# Patient Record
Sex: Female | Born: 1935 | Race: White | Hispanic: No | Smoking: Never smoker
Health system: Southern US, Community
[De-identification: ages and names within clinical notes are randomized; demographics above are authoritative.]

## PROBLEM LIST (undated history)

## (undated) DIAGNOSIS — E785 Hyperlipidemia, unspecified: Secondary | ICD-10-CM

## (undated) DIAGNOSIS — I10 Essential (primary) hypertension: Secondary | ICD-10-CM

## (undated) HISTORY — DX: Essential (primary) hypertension: I10

## (undated) HISTORY — DX: Hyperlipidemia, unspecified: E78.5

## (undated) HISTORY — PX: ABDOMINAL HYSTERECTOMY: SHX81

## (undated) HISTORY — PX: PARTIAL KNEE ARTHROPLASTY: SHX2174

---

## 2015-05-10 DIAGNOSIS — Z23 Encounter for immunization: Secondary | ICD-10-CM | POA: Diagnosis not present

## 2015-10-08 DIAGNOSIS — E669 Obesity, unspecified: Secondary | ICD-10-CM | POA: Diagnosis not present

## 2015-10-08 DIAGNOSIS — Z0000 Encounter for general adult medical examination without abnormal findings: Secondary | ICD-10-CM | POA: Diagnosis not present

## 2015-10-08 DIAGNOSIS — I10 Essential (primary) hypertension: Secondary | ICD-10-CM | POA: Diagnosis not present

## 2015-10-08 DIAGNOSIS — E785 Hyperlipidemia, unspecified: Secondary | ICD-10-CM | POA: Diagnosis not present

## 2015-10-08 DIAGNOSIS — K219 Gastro-esophageal reflux disease without esophagitis: Secondary | ICD-10-CM | POA: Diagnosis not present

## 2015-10-10 DIAGNOSIS — J069 Acute upper respiratory infection, unspecified: Secondary | ICD-10-CM | POA: Diagnosis not present

## 2015-10-10 DIAGNOSIS — M1712 Unilateral primary osteoarthritis, left knee: Secondary | ICD-10-CM | POA: Diagnosis not present

## 2015-11-12 DIAGNOSIS — M2550 Pain in unspecified joint: Secondary | ICD-10-CM | POA: Diagnosis not present

## 2015-12-12 DIAGNOSIS — M549 Dorsalgia, unspecified: Secondary | ICD-10-CM | POA: Diagnosis not present

## 2015-12-19 DIAGNOSIS — R300 Dysuria: Secondary | ICD-10-CM | POA: Diagnosis not present

## 2016-01-28 DIAGNOSIS — M549 Dorsalgia, unspecified: Secondary | ICD-10-CM | POA: Diagnosis not present

## 2016-02-28 DIAGNOSIS — M549 Dorsalgia, unspecified: Secondary | ICD-10-CM | POA: Diagnosis not present

## 2016-03-15 DIAGNOSIS — R350 Frequency of micturition: Secondary | ICD-10-CM | POA: Diagnosis not present

## 2016-03-15 DIAGNOSIS — N3001 Acute cystitis with hematuria: Secondary | ICD-10-CM | POA: Diagnosis not present

## 2016-05-04 DIAGNOSIS — Z23 Encounter for immunization: Secondary | ICD-10-CM | POA: Diagnosis not present

## 2016-06-16 DIAGNOSIS — H6120 Impacted cerumen, unspecified ear: Secondary | ICD-10-CM | POA: Diagnosis not present

## 2016-08-05 ENCOUNTER — Encounter: Payer: Self-pay | Admitting: Pediatrics

## 2016-08-05 ENCOUNTER — Encounter (INDEPENDENT_AMBULATORY_CARE_PROVIDER_SITE_OTHER): Payer: Self-pay

## 2016-08-05 ENCOUNTER — Ambulatory Visit (INDEPENDENT_AMBULATORY_CARE_PROVIDER_SITE_OTHER): Payer: Medicare HMO | Admitting: Pediatrics

## 2016-08-05 VITALS — BP 125/82 | HR 74 | Temp 96.9°F | Ht 71.0 in | Wt 278.0 lb

## 2016-08-05 DIAGNOSIS — I10 Essential (primary) hypertension: Secondary | ICD-10-CM

## 2016-08-05 DIAGNOSIS — Z23 Encounter for immunization: Secondary | ICD-10-CM | POA: Diagnosis not present

## 2016-08-05 DIAGNOSIS — E785 Hyperlipidemia, unspecified: Secondary | ICD-10-CM | POA: Diagnosis not present

## 2016-08-05 DIAGNOSIS — K219 Gastro-esophageal reflux disease without esophagitis: Secondary | ICD-10-CM | POA: Diagnosis not present

## 2016-08-05 DIAGNOSIS — D649 Anemia, unspecified: Secondary | ICD-10-CM

## 2016-08-05 DIAGNOSIS — Z780 Asymptomatic menopausal state: Secondary | ICD-10-CM | POA: Diagnosis not present

## 2016-08-05 DIAGNOSIS — R5383 Other fatigue: Secondary | ICD-10-CM

## 2016-08-05 MED ORDER — PANTOPRAZOLE SODIUM 40 MG PO TBEC: 40.0000 mg | DELAYED_RELEASE_TABLET | Freq: Every day | ORAL | 3 refills | Status: DC

## 2016-08-05 MED ORDER — ATORVASTATIN CALCIUM 20 MG PO TABS: 20.0000 mg | ORAL_TABLET | Freq: Every day | ORAL | 3 refills | Status: DC

## 2016-08-05 MED ORDER — LISINOPRIL 20 MG PO TABS: 20.0000 mg | ORAL_TABLET | Freq: Every day | ORAL | 3 refills | Status: DC

## 2016-08-05 NOTE — Progress Notes (Signed)
Subjective:   Patient ID: Anita Daniels, female    DOB: 1936/04/19, 81 y.o.   MRN: [id number] CC: New Patient (Initial Visit) follow up multiple med problems  HPI: Anita Daniels is a 81 y.o. female presenting for New Patient (Initial Visit)  Lives in [place] out with 2yo grandson, feels tired by the end of the day Other days has fairly good energy levels  Elevated BMI: Drinks [place] and Coke daily in the morning  HTN: takes medicines regularly No CP, no SOB, no HA  Pt has had u/s to look for abd aneurysm given mother's hx, normal per pt  GER: fried foods irritate stomach  Colon ca screen: never had colonoscopy, had stool occult blood test several years ago was normal Normal stooling now, no blood Bone density scan: last over 5 yrs ago Flu shot in Oct 2017 Pneumonia vaccine: says has never received No h/o abnormal mammogram   Past Medical History:  Diagnosis Date  . Hyperlipidemia   . Hypertension    Fam hx: father died long time ago, pt was 6yo Mom died of abd aneurysm  Social History   Social History  . Marital status: Widowed    Spouse name: N/A  . Number of children: N/A  . Years of education: N/A   Social History Main Topics  . Smoking status: Never Smoker  . Smokeless tobacco: Never Used  . Alcohol use No  . Drug use: No  . Sexual activity: No   Other Topics Concern  . None   Social History Narrative  . None   ROS: All systems negative other than what is in HPI  Objective:    BP 125/82   Pulse 74   Temp (!) 96.9 F (36.1 C) (Oral)   Ht 5' 11"  (1.803 m)   Wt 278 lb (126.1 kg)   BMI 38.77 kg/m   Wt Readings from Last 3 Encounters:  08/05/16 278 lb (126.1 kg)    Gen: NAD, alert, cooperative with exam, NCAT EYES: EOMI, no conjunctival injection, or no icterus ENT: OP without erythema LYMPH: no cervical LAD CV: NRRR, normal S1/S2, no murmur, distal pulses 2+ b/l Resp: CTABL, no wheezes, normal WOB Abd: +BS, soft, NTND.  no guarding or organomegaly Ext: No edema, warm Neuro: Alert and oriented, strength equal b/l UE and LE, valgus gait MSK: normal muscle bulk  Assessment & Plan:  Anita Daniels was seen today for new patient (initial visit), follow up med problems.  Diagnoses and all orders for this visit:  Hyperlipidemia, unspecified hyperlipidemia type Stable Cont atorvastatinv -     Lipid panel -     atorvastatin (LIPITOR) 20 MG tablet; Take 1 tablet (20 mg total) by mouth daily.  Essential hypertension Well controlled today, cont lisinopril -     CMP14+EGFR -     lisinopril (PRINIVIL,ZESTRIL) 20 MG tablet; Take 1 tablet (20 mg total) by mouth daily.  Anemia, unspecified type -     CBC with Differential/Platelet  Other fatigue -     TSH  Post-menopausal -     DG [facility] DEXA  Gastroesophageal reflux disease, esophagitis presence not specified Well controlled with below Takes daily Not sure if symptoms worse without it, try stopping PPI -     pantoprazole (PROTONIX) 40 MG tablet; Take 1 tablet (40 mg total) by mouth daily.  Need for vaccination against Streptococcus pneumoniae using pneumococcal conjugate vaccine 7 -     Pneumococcal conjugate vaccine 13-valent   Follow up  plan: Return in about 6 months (around 02/02/2017). Assunta Found, MD [place]

## 2016-08-06 LAB — CMP14+EGFR
ALBUMIN: 4 g/dL (ref 3.5–4.8)
ALK PHOS: 96 IU/L (ref 39–117)
ALT: 9 IU/L (ref 0–32)
AST: 15 IU/L (ref 0–40)
Albumin/Globulin Ratio: 1.4 (ref 1.2–2.2)
BUN / CREAT RATIO: 21 (ref 12–28)
BUN: 16 mg/dL (ref 8–27)
Bilirubin Total: 0.8 mg/dL (ref 0.0–1.2)
CO2: 22 mmol/L (ref 18–29)
CREATININE: 0.77 mg/dL (ref 0.57–1.00)
Calcium: 8.7 mg/dL (ref 8.7–10.3)
Chloride: 103 mmol/L (ref 96–106)
GFR, EST AFRICAN AMERICAN: 85 mL/min/{1.73_m2} (ref >59)
GFR, EST NON AFRICAN AMERICAN: 74 mL/min/{1.73_m2} (ref >59)
GLOBULIN, TOTAL: 2.8 g/dL (ref 1.5–4.5)
GLUCOSE: 94 mg/dL (ref 65–99)
Potassium: 3.8 mmol/L (ref 3.5–5.2)
SODIUM: 144 mmol/L (ref 134–144)
TOTAL PROTEIN: 6.8 g/dL (ref 6.0–8.5)

## 2016-08-06 LAB — CBC WITH DIFFERENTIAL/PLATELET
BASOS: 0 %
Basophils Absolute: 0 10*3/uL (ref 0.0–0.2)
EOS (ABSOLUTE): 0.1 10*3/uL (ref 0.0–0.4)
EOS: 1 %
HEMATOCRIT: 45.1 % (ref 34.0–46.6)
HEMOGLOBIN: 14.5 g/dL (ref 11.1–15.9)
Immature Grans (Abs): 0 10*3/uL (ref 0.0–0.1)
Immature Granulocytes: 0 %
LYMPHS ABS: 3.7 10*3/uL — AB (ref 0.7–3.1)
Lymphs: 32 %
MCH: 30.3 pg (ref 26.6–33.0)
MCHC: 32.2 g/dL (ref 31.5–35.7)
MCV: 94 fL (ref 79–97)
MONOCYTES: 8 %
MONOS ABS: 0.9 10*3/uL (ref 0.1–0.9)
NEUTROS ABS: 6.8 10*3/uL (ref 1.4–7.0)
Neutrophils: 59 %
Platelets: 246 10*3/uL (ref 150–379)
RBC: 4.79 x10E6/uL (ref 3.77–5.28)
RDW: 14.3 % (ref 12.3–15.4)
WBC: 11.6 10*3/uL — ABNORMAL HIGH (ref 3.4–10.8)

## 2016-08-06 LAB — LIPID PANEL
CHOLESTEROL TOTAL: 171 mg/dL (ref 100–199)
Chol/HDL Ratio: 3.1 ratio units (ref 0.0–4.4)
HDL: 56 mg/dL (ref >39)
LDL Calculated: 96 mg/dL (ref 0–99)
Triglycerides: 97 mg/dL (ref 0–149)
VLDL CHOLESTEROL CAL: 19 mg/dL (ref 5–40)

## 2016-08-06 LAB — TSH: TSH: 2.46 u[IU]/mL (ref 0.450–4.500)

## 2016-08-18 ENCOUNTER — Telehealth: Payer: Self-pay | Admitting: Pediatrics

## 2016-08-18 NOTE — Telephone Encounter (Signed)
[facility] for 10am .but we can reschedule for a time of her convenience

## 2016-08-20 ENCOUNTER — Other Ambulatory Visit: Payer: Medicare HMO

## 2016-08-24 ENCOUNTER — Other Ambulatory Visit: Payer: Medicare HMO

## 2016-08-26 ENCOUNTER — Ambulatory Visit (INDEPENDENT_AMBULATORY_CARE_PROVIDER_SITE_OTHER): Payer: Medicare HMO

## 2016-08-26 DIAGNOSIS — Z780 Asymptomatic menopausal state: Secondary | ICD-10-CM | POA: Diagnosis not present

## 2016-09-09 NOTE — Telephone Encounter (Signed)
Pt had DEXA 08/26/16

## 2017-01-13 ENCOUNTER — Ambulatory Visit (INDEPENDENT_AMBULATORY_CARE_PROVIDER_SITE_OTHER): Payer: Medicare HMO | Admitting: Pediatrics

## 2017-01-13 ENCOUNTER — Encounter: Payer: Self-pay | Admitting: Pediatrics

## 2017-01-13 DIAGNOSIS — I10 Essential (primary) hypertension: Secondary | ICD-10-CM | POA: Diagnosis not present

## 2017-01-13 DIAGNOSIS — E785 Hyperlipidemia, unspecified: Secondary | ICD-10-CM | POA: Diagnosis not present

## 2017-01-13 DIAGNOSIS — K219 Gastro-esophageal reflux disease without esophagitis: Secondary | ICD-10-CM

## 2017-01-13 MED ORDER — ATORVASTATIN CALCIUM 20 MG PO TABS: 20.0000 mg | ORAL_TABLET | Freq: Every day | ORAL | 3 refills | Status: DC

## 2017-01-13 MED ORDER — PANTOPRAZOLE SODIUM 40 MG PO TBEC: 40.0000 mg | DELAYED_RELEASE_TABLET | Freq: Every day | ORAL | 3 refills | Status: DC

## 2017-01-13 MED ORDER — LISINOPRIL 20 MG PO TABS: 20.0000 mg | ORAL_TABLET | Freq: Every day | ORAL | 3 refills | Status: DC

## 2017-01-13 NOTE — Progress Notes (Signed)
  Subjective:   Patient ID: Anita Daniels, female    DOB: 11/22/1935, 81 y.o.   MRN: [id number] CC: Follow-up (6 month)  HPI: Anita Daniels is a 81 y.o. female presenting for Follow-up (6 month)  HTN: no CP, no SOB Not checking BPs at home, says her daughter is RN, could check if she asked her to  HLD: no leg cramps, taking statin regularly  GERD: taking protonix every day  Relevant past medical, surgical, family and social history reviewed. Allergies and medications reviewed and updated. History  Smoking Status  . Never Smoker  Smokeless Tobacco  . Never Used   ROS: Per HPI   Objective:    BP (!) 147/81   Pulse 86   Temp 97.2 F (36.2 C) (Oral)   Ht 5\' 11"  (1.803 m)   Wt 277 lb 9.6 oz (125.9 kg)   BMI 38.72 kg/m   Wt Readings from Last 3 Encounters:  01/13/17 277 lb 9.6 oz (125.9 kg)  08/05/16 278 lb (126.1 kg)    Gen: NAD, alert, cooperative with exam, NCAT EYES: EOMI, no conjunctival injection, or no icterus ENT: OP without erythema CV: NRRR, normal S1/S2, no murmur, distal pulses 2+ b/l Resp: CTABL, no wheezes, normal WOB Abd:  soft, obese, NTND. no guarding Ext: No edema, warm Neuro: Alert and oriented  Assessment & Plan:  Anita Daniels was seen today for follow-up multiple problems  Diagnoses and all orders for this visit:  Hyperlipidemia, unspecified hyperlipidemia type Stable, cont current meds -     atorvastatin (LIPITOR) 20 MG tablet; Take 1 tablet (20 mg total) by mouth daily.  Essential hypertension Slightly elevated in clinic, on lisinopril, check BPs at home, let me know if stays elevated -     lisinopril (PRINIVIL,ZESTRIL) 20 MG tablet; Take 1 tablet (20 mg total) by mouth daily.  Gastroesophageal reflux disease, esophagitis presence not specified Stable, no symptoms on daily PPI, can decrease to qod, if symptoms restart restart PPI daily -     pantoprazole (PROTONIX) 40 MG tablet; Take 1 tablet (40 mg total) by mouth daily.   Follow up  plan: Return in about 6 months (around 07/15/2017). Rex Krasarol Vincent, MD [facility]

## 2017-04-19 DIAGNOSIS — R69 Illness, unspecified: Secondary | ICD-10-CM | POA: Diagnosis not present

## 2017-07-15 ENCOUNTER — Ambulatory Visit: Payer: Medicare HMO | Admitting: Pediatrics

## 2017-07-20 ENCOUNTER — Ambulatory Visit: Payer: Medicare HMO | Admitting: Pediatrics

## 2017-07-20 ENCOUNTER — Encounter: Payer: Self-pay | Admitting: Pediatrics

## 2017-07-20 DIAGNOSIS — I10 Essential (primary) hypertension: Secondary | ICD-10-CM

## 2017-07-20 DIAGNOSIS — K219 Gastro-esophageal reflux disease without esophagitis: Secondary | ICD-10-CM

## 2017-07-20 DIAGNOSIS — E785 Hyperlipidemia, unspecified: Secondary | ICD-10-CM

## 2017-07-20 MED ORDER — PANTOPRAZOLE SODIUM 20 MG PO TBEC: 40.0000 mg | DELAYED_RELEASE_TABLET | Freq: Every day | ORAL | 5 refills | Status: DC

## 2017-07-20 MED ORDER — LISINOPRIL 20 MG PO TABS: 20.0000 mg | ORAL_TABLET | Freq: Every day | ORAL | 3 refills | Status: DC

## 2017-07-20 MED ORDER — ATORVASTATIN CALCIUM 20 MG PO TABS: 20.0000 mg | ORAL_TABLET | Freq: Every day | ORAL | 3 refills | Status: DC

## 2017-07-20 NOTE — Progress Notes (Signed)
  Subjective:   Patient ID: Anita Daniels, female    DOB: 12/02/1935, 81 y.o.   MRN: [id number] CC: Follow-up (6 month) multiple med problems  HPI: Anita Daniels is a 81 y.o. female presenting for Follow-up (6 month)  Not doing regular exercise Says she cant get motivated to walk Takes the trash cans down once a week Checks her mail while driving out of the driveway  Hypertension: currently taking meds regularly no chest pain, shortness of breath  Hyperlipidemia: Taking Lipitor regularly, no side effects  GERD: Try decreasing pantoprazole.  She does not remember symptoms getting worse but does remember restarting again after few weeks She likes eating spicy foods  Her son and his family with grandkids [place] to [place], pt no longer watching the kids regularly, not as active as she used to be she says  Relevant past medical, surgical, family and social history reviewed. Allergies and medications reviewed and updated. Social History   Tobacco Use  Smoking Status Never Smoker  Smokeless Tobacco Never Used   ROS: Per HPI   Objective:    BP 132/87   Pulse 80   Temp (!) 97 F (36.1 C) (Oral)   Ht 5\' 11"  (1.803 m)   Wt 262 lb (118.8 kg)   BMI 36.54 kg/m   Wt Readings from Last 3 Encounters:  07/20/17 262 lb (118.8 kg)  01/13/17 277 lb 9.6 oz (125.9 kg)  08/05/16 278 lb (126.1 kg)    Gen: NAD, alert, cooperative with exam, NCAT EYES: EOMI, no conjunctival injection, or no icterus ENT:  TMs pearly gray b/l, OP without erythema LYMPH: no cervical LAD CV: NRRR, normal S1/S2 Resp: CTABL, no wheezes, normal WOB Abd: +BS, soft, NTND. no guarding or organomegaly Ext: No edema, warm Neuro: Alert and oriented, strength equal b/l UE and LE, coordination grossly normal MSK: normal muscle bulk  Assessment & Plan:  Anita Daniels was seen today for follow-up multiple medical problems  Diagnoses and all orders for this visit:  Hyperlipidemia, unspecified hyperlipidemia  type Stable, continue below -     atorvastatin (LIPITOR) 20 MG tablet; Take 1 tablet (20 mg total) by mouth daily.  Essential hypertension Stable, continue below Labs today -     lisinopril (PRINIVIL,ZESTRIL) 20 MG tablet; Take 1 tablet (20 mg total) by mouth daily.  Gastroesophageal reflux disease, esophagitis presence not specified Tried weaning off of it after last visit, patient does not remember why she restarted it Will decrease to 20 mg tablet, take once a day as needed, can go back up to 40 mg once a day symptoms worsen. will continue to try to wean.  Avoid exacerbating foods   Follow up plan: 6 Months, sooner if needed Rex Krasarol Steel Kerney, MD [facility]

## 2017-07-21 LAB — BMP8+EGFR
BUN/Creatinine Ratio: 25 (ref 12–28)
BUN: 21 mg/dL (ref 8–27)
CO2: 24 mmol/L (ref 20–29)
CREATININE: 0.84 mg/dL (ref 0.57–1.00)
Calcium: 9.2 mg/dL (ref 8.7–10.3)
Chloride: 106 mmol/L (ref 96–106)
GFR, EST AFRICAN AMERICAN: 76 mL/min/{1.73_m2} (ref >59)
GFR, EST NON AFRICAN AMERICAN: 66 mL/min/{1.73_m2} (ref >59)
Glucose: 96 mg/dL (ref 65–99)
POTASSIUM: 4.3 mmol/L (ref 3.5–5.2)
SODIUM: 144 mmol/L (ref 134–144)

## 2017-07-23 ENCOUNTER — Telehealth: Payer: Self-pay | Admitting: Pediatrics

## 2017-07-23 DIAGNOSIS — K219 Gastro-esophageal reflux disease without esophagitis: Secondary | ICD-10-CM

## 2017-07-23 MED ORDER — PANTOPRAZOLE SODIUM 40 MG PO TBEC: 40.0000 mg | DELAYED_RELEASE_TABLET | Freq: Every day | ORAL | 5 refills | Status: DC

## 2017-07-23 NOTE — Telephone Encounter (Signed)
rx changed back to 40mg  Okay per Dr Oswaldo DoneVincent

## 2017-07-29 ENCOUNTER — Other Ambulatory Visit: Payer: Self-pay | Admitting: *Deleted

## 2017-07-29 DIAGNOSIS — K219 Gastro-esophageal reflux disease without esophagitis: Secondary | ICD-10-CM

## 2017-07-29 MED ORDER — PANTOPRAZOLE SODIUM 40 MG PO TBEC: 40.0000 mg | DELAYED_RELEASE_TABLET | Freq: Every day | ORAL | 2 refills | Status: DC

## 2017-08-06 ENCOUNTER — Telehealth: Payer: Self-pay | Admitting: Pediatrics

## 2017-08-06 NOTE — Telephone Encounter (Signed)
Aware. Script for protonix 40 mg verified.

## 2018-01-19 ENCOUNTER — Encounter: Payer: Self-pay | Admitting: Pediatrics

## 2018-01-19 ENCOUNTER — Ambulatory Visit: Payer: Medicare HMO | Admitting: Pediatrics

## 2018-01-19 VITALS — BP 136/80 | HR 74 | Temp 97.1°F | Ht 71.0 in | Wt 279.4 lb

## 2018-01-19 DIAGNOSIS — K219 Gastro-esophageal reflux disease without esophagitis: Secondary | ICD-10-CM | POA: Diagnosis not present

## 2018-01-19 DIAGNOSIS — I10 Essential (primary) hypertension: Secondary | ICD-10-CM

## 2018-01-19 DIAGNOSIS — E669 Obesity, unspecified: Secondary | ICD-10-CM | POA: Diagnosis not present

## 2018-01-19 DIAGNOSIS — E785 Hyperlipidemia, unspecified: Secondary | ICD-10-CM

## 2018-01-19 MED ORDER — LISINOPRIL 20 MG PO TABS: 20.0000 mg | ORAL_TABLET | Freq: Every day | ORAL | 1 refills | Status: DC

## 2018-01-19 MED ORDER — PANTOPRAZOLE SODIUM 40 MG PO TBEC: 40.0000 mg | DELAYED_RELEASE_TABLET | Freq: Every day | ORAL | 1 refills | Status: DC

## 2018-01-19 MED ORDER — ATORVASTATIN CALCIUM 20 MG PO TABS: 20.0000 mg | ORAL_TABLET | Freq: Every day | ORAL | 1 refills | Status: DC

## 2018-01-19 NOTE — Progress Notes (Signed)
  Subjective:   Patient ID: Anita Daniels, female    DOB: 01/11/1936, 82 y.o.   MRN: [id number] CC: Medical Management of Chronic Issues  HPI: Anita Daniels is a 82 y.o. female   Hypertension: No headaches or chest pain.  She tries to walk 20 to 30 minutes every day.  Often drives to different location to do it.  Elevated BMI: Mostly eats out.  Has been trying to avoid sugary drinks and carbohydrates.  Hyperlipidemia: Tolerating medicine.  No side effects.  GERD: Tried weaning off of it several months ago, had return of symptoms.  Has been taking PPI regularly since then.  Earwax: Every few months earwax gets to the point where she thinks it starts to affect her hearing.  Has not been using any over-the-counter earwax drops.  Relevant past medical, surgical, family and social history reviewed. Allergies and medications reviewed and updated. Social History   Tobacco Use  Smoking Status Never Smoker  Smokeless Tobacco Never Used   ROS: Per HPI   Objective:    BP 136/80   Pulse 74   Temp (!) 97.1 F (36.2 C) (Oral)   Ht 5\' 11"  (1.803 m)   Wt 279 lb 6.4 oz (126.7 kg)   BMI 38.97 kg/m   Wt Readings from Last 3 Encounters:  01/19/18 279 lb 6.4 oz (126.7 kg)  07/20/17 262 lb (118.8 kg)  01/13/17 277 lb 9.6 oz (125.9 kg)    Gen: NAD, alert, cooperative with exam, NCAT EYES: EOMI, no conjunctival injection, or no icterus ENT:  TMs obscured by cerumen b/l, OP without erythema LYMPH: no cervical LAD CV: NRRR, normal S1/S2, no murmur, distal pulses 2+ b/l Resp: CTABL, no wheezes, normal WOB Abd: +BS, soft, NTND. no guarding or organomegaly Ext: No pitting edema, warm Neuro: Alert and oriented  Assessment & Plan:  Anita Daniels was seen today for medical management of chronic issues.  Diagnoses and all orders for this visit:  Hyperlipidemia, unspecified hyperlipidemia type Stable, continue below -     atorvastatin (LIPITOR) 20 MG tablet; Take 1 tablet (20 mg total) by  mouth daily.  Essential hypertension Stable, continue below -     lisinopril (PRINIVIL,ZESTRIL) 20 MG tablet; Take 1 tablet (20 mg total) by mouth daily.  Gastroesophageal reflux disease, esophagitis presence not specified Stable, continue below -     pantoprazole (PROTONIX) 40 MG tablet; Take 1 tablet (40 mg total) by mouth daily.  Obesity Continue regular activity, discussed avoiding sugary foods, controlling portion sizes.  Follow up plan: Return in about 6 months (around 07/21/2018). Rex Krasarol Eliseo Withers, MD [facility]

## 2018-01-19 NOTE — Patient Instructions (Signed)
Try debrox drops or similar for ear wax

## 2018-04-27 DIAGNOSIS — R69 Illness, unspecified: Secondary | ICD-10-CM | POA: Diagnosis not present

## 2018-07-05 ENCOUNTER — Other Ambulatory Visit: Payer: Self-pay | Admitting: Pediatrics

## 2018-07-05 DIAGNOSIS — E785 Hyperlipidemia, unspecified: Secondary | ICD-10-CM

## 2018-07-05 DIAGNOSIS — I10 Essential (primary) hypertension: Secondary | ICD-10-CM

## 2018-07-05 DIAGNOSIS — K219 Gastro-esophageal reflux disease without esophagitis: Secondary | ICD-10-CM

## 2018-07-06 ENCOUNTER — Encounter: Payer: Self-pay | Admitting: Pediatrics

## 2018-07-06 ENCOUNTER — Ambulatory Visit (INDEPENDENT_AMBULATORY_CARE_PROVIDER_SITE_OTHER): Payer: Medicare HMO | Admitting: Pediatrics

## 2018-07-06 VITALS — BP 129/81 | HR 63 | Temp 96.9°F | Ht 71.0 in | Wt 277.4 lb

## 2018-07-06 DIAGNOSIS — E785 Hyperlipidemia, unspecified: Secondary | ICD-10-CM

## 2018-07-06 DIAGNOSIS — E669 Obesity, unspecified: Secondary | ICD-10-CM | POA: Diagnosis not present

## 2018-07-06 DIAGNOSIS — I10 Essential (primary) hypertension: Secondary | ICD-10-CM | POA: Diagnosis not present

## 2018-07-06 NOTE — Progress Notes (Signed)
  Subjective:   Patient ID: Anita Daniels, female    DOB: 1936/06/25, 82 y.o.   MRN: [id number] CC: Medical Management of Chronic Issues (6 month)  HPI: Anita Daniels is a 82 y.o. female   Here today for follow-up.  Overall is been feeling well.  Hypertension: Taking medicine regularly.  No lightheadedness, shortness of breath or chest pain.  Hyperlipidemia: Tolerating statin, no side effects  GERD: Taking PPI regularly, has GERD symptoms if she misses doses.  Staying active when she can.  Grandchildren recently visited.  Trying to avoid sugary foods and beverages.  Relevant past medical, surgical, family and social history reviewed. Allergies and medications reviewed and updated. Social History   Tobacco Use  Smoking Status Never Smoker  Smokeless Tobacco Never Used   ROS: Per HPI   Objective:    BP 129/81   Pulse 63   Temp (!) 96.9 F (36.1 C) (Oral)   Ht '5\' 11"'$  (1.803 m)   Wt 277 lb 6.4 oz (125.8 kg)   BMI 38.69 kg/m   Wt Readings from Last 3 Encounters:  07/06/18 277 lb 6.4 oz (125.8 kg)  01/19/18 279 lb 6.4 oz (126.7 kg)  07/20/17 262 lb (118.8 kg)    Gen: NAD, alert, cooperative with exam, NCAT EYES: EOMI, no conjunctival injection, or no icterus ENT:  OP without erythema LYMPH: no cervical LAD CV: NRRR, normal S1/S2 Resp: CTABL, no wheezes, normal WOB Abd: +BS, soft, NTND. no guarding or organomegaly Ext: No edema, warm Neuro: Alert and oriented MSK: normal muscle bulk  Assessment & Plan:  Anita Daniels was seen today for medical management of chronic issues.  Diagnoses and all orders for this visit:  Essential hypertension Stable, continue current med -     BMP8+EGFR  Hyperlipidemia, unspecified hyperlipidemia type Stable, continue atorvastatin  Obesity (BMI 30-39.9) Continue lifestyle changes, active lifestyle, increasing fruit and vegetable intake, decreasing carbohydrate and sugary food intake.  Follow up plan: Return in about 6 months  (around 01/05/2019). Assunta Found, MD [place]

## 2018-07-07 LAB — BMP8+EGFR
BUN / CREAT RATIO: 18 (ref 12–28)
BUN: 14 mg/dL (ref 8–27)
CO2: 24 mmol/L (ref 20–29)
Calcium: 9.6 mg/dL (ref 8.7–10.3)
Chloride: 106 mmol/L (ref 96–106)
Creatinine, Ser: 0.76 mg/dL (ref 0.57–1.00)
GFR, EST AFRICAN AMERICAN: 86 mL/min/{1.73_m2} (ref >59)
GFR, EST NON AFRICAN AMERICAN: 74 mL/min/{1.73_m2} (ref >59)
Glucose: 97 mg/dL (ref 65–99)
Potassium: 4.1 mmol/L (ref 3.5–5.2)
Sodium: 144 mmol/L (ref 134–144)

## 2018-07-22 ENCOUNTER — Ambulatory Visit: Payer: Medicare HMO | Admitting: Pediatrics

## 2018-08-31 ENCOUNTER — Telehealth: Payer: Self-pay | Admitting: Pediatrics

## 2018-08-31 NOTE — Telephone Encounter (Signed)
Patient switched providers from [place] to Ms. Daphine Deutscher.   Patient is 83 years old and walks with a cane.  She needs trash pick up people to come into her long driveway to get trash.. Please write detailed letter to give to waste department to aid with this.  Please advise. Thank you

## 2018-08-31 NOTE — Telephone Encounter (Signed)
Aware , letter to get aid with her trash can has been mailed to her.

## 2018-08-31 NOTE — Telephone Encounter (Signed)
Ok to write letter for [employer]

## 2018-12-09 ENCOUNTER — Encounter: Payer: Self-pay | Admitting: Nurse Practitioner

## 2018-12-09 ENCOUNTER — Ambulatory Visit (INDEPENDENT_AMBULATORY_CARE_PROVIDER_SITE_OTHER): Payer: Medicare HMO | Admitting: Nurse Practitioner

## 2018-12-09 ENCOUNTER — Encounter (INDEPENDENT_AMBULATORY_CARE_PROVIDER_SITE_OTHER): Payer: Self-pay

## 2018-12-09 ENCOUNTER — Other Ambulatory Visit: Payer: Self-pay

## 2018-12-09 ENCOUNTER — Telehealth: Payer: Self-pay | Admitting: Nurse Practitioner

## 2018-12-09 DIAGNOSIS — E785 Hyperlipidemia, unspecified: Secondary | ICD-10-CM

## 2018-12-09 DIAGNOSIS — R609 Edema, unspecified: Secondary | ICD-10-CM

## 2018-12-09 DIAGNOSIS — R059 Cough, unspecified: Secondary | ICD-10-CM

## 2018-12-09 DIAGNOSIS — K219 Gastro-esophageal reflux disease without esophagitis: Secondary | ICD-10-CM

## 2018-12-09 DIAGNOSIS — R05 Cough: Secondary | ICD-10-CM

## 2018-12-09 DIAGNOSIS — E669 Obesity, unspecified: Secondary | ICD-10-CM | POA: Diagnosis not present

## 2018-12-09 DIAGNOSIS — I10 Essential (primary) hypertension: Secondary | ICD-10-CM | POA: Insufficient documentation

## 2018-12-09 HISTORY — DX: Gastro-esophageal reflux disease without esophagitis: K21.9

## 2018-12-09 MED ORDER — ATORVASTATIN CALCIUM 20 MG PO TABS: 20.0000 mg | ORAL_TABLET | Freq: Every day | ORAL | 1 refills | Status: DC

## 2018-12-09 MED ORDER — FUROSEMIDE 20 MG PO TABS: 20.0000 mg | ORAL_TABLET | Freq: Every day | ORAL | 3 refills | Status: DC

## 2018-12-09 MED ORDER — PANTOPRAZOLE SODIUM 40 MG PO TBEC: 40.0000 mg | DELAYED_RELEASE_TABLET | Freq: Every day | ORAL | 1 refills | Status: DC

## 2018-12-09 MED ORDER — LISINOPRIL 20 MG PO TABS: 20.0000 mg | ORAL_TABLET | Freq: Every day | ORAL | 1 refills | Status: DC

## 2018-12-09 NOTE — Progress Notes (Signed)
Virtual Visit via telephone Note  I connected with Anita BroadBetty Jene Oesterling on 12/09/18 at 9:15 AM by telephone and verified that I am speaking with the correct person using two identifiers. Anita Daniels is currently located at home and no one is currently with her during visit. The provider, Mary-Margaret Daphine DeutscherMartin, FNP is located in their office at time of visit.  I discussed the limitations, risks, security and privacy concerns of performing an evaluation and management service by telephone and the availability of in person appointments. I also discussed with the patient that there may be a patient responsible charge related to this service. The patient expressed understanding and agreed to proceed.   History and Present Illness:   Chief Complaint: medicall management of chronic issues   HPI:  1. Essential hypertension No c/o chest pain, sob or headache. Does not check blood pressure at home. BP Readings from Last 3 Encounters:  07/06/18 129/81  01/19/18 136/80  07/20/17 132/87     2. Hyperlipidemia, unspecified hyperlipidemia type Not really watching dieting and does very little exercise  3. Gastroesophageal reflux disease, esophagitis presence not specified Takes protonix daily and works well to keep symptoms under control  4. Obesity (BMI 30-39.9) No recent weight changes.    Outpatient Encounter Medications as of 12/09/2018  Medication Sig  . atorvastatin (LIPITOR) 20 MG tablet TAKE 1 TABLET DAILY  . FLUAD 0.5 ML SUSY inject 0.5 milliliter intramuscularly  . lisinopril (PRINIVIL,ZESTRIL) 20 MG tablet TAKE 1 TABLET DAILY  . pantoprazole (PROTONIX) 40 MG tablet TAKE 1 TABLET DAILY      New complaints: - has slight cough and congestion, no fever- just feels yucky - some lower ext edema for the last 2 weeks. She says it will go away with elevation, but returns when she is up on her feet.   Social history: Lives by herself     Review of Systems  Constitutional:  Negative.  Negative for chills, diaphoresis, fever and weight loss.  HENT: Positive for congestion.   Eyes: Negative for blurred vision, double vision and pain.  Respiratory: Positive for cough (slight). Negative for shortness of breath.   Cardiovascular: Positive for leg swelling. Negative for chest pain, palpitations and orthopnea.  Gastrointestinal: Negative for abdominal pain.  Skin: Negative for rash.  Neurological: Negative for dizziness, sensory change, loss of consciousness, weakness and headaches.  Endo/Heme/Allergies: Negative for polydipsia. Does not bruise/bleed easily.  Psychiatric/Behavioral: Negative for memory loss. The patient does not have insomnia.   All other systems reviewed and are negative.    Observations/Objective: Alert and oriented- answers all questions appropriately No distress noted in voice No cough and does not sound short of breathe.  Assessment and Plan: Anita Daniels comes in today with chief complaint of Medical Management of Chronic Issues   Diagnosis and orders addressed:  1. Essential hypertension Low sodium diet - lisinopril (ZESTRIL) 20 MG tablet; Take 1 tablet (20 mg total) by mouth daily.  Dispense: 90 tablet; Refill: 1  2. Hyperlipidemia, unspecified hyperlipidemia type Low fat diet - atorvastatin (LIPITOR) 20 MG tablet; Take 1 tablet (20 mg total) by mouth daily.  Dispense: 90 tablet; Refill: 1  3. Gastroesophageal reflux disease, esophagitis presence not specified Avoid spicy foods Do not eat 2 hours prior to bedtime - pantoprazole (PROTONIX) 40 MG tablet; Take 1 tablet (40 mg total) by mouth daily.  Dispense: 90 tablet; Refill: 1  4. Obesity (BMI 30-39.9) Discussed diet and exercise for person with BMI >25 Will  recheck weight in 3-6 months  5. Peripheral edema elevate legs when seating Compression socks will help - furosemide (LASIX) 20 MG tablet; Take 1 tablet (20 mg total) by mouth daily.  Dispense: 30 tablet; Refill:  3  6. Cough Try claritin OTC   Previous labs reviewed Health Maintenance reviewed Diet and exercise encouraged  Follow up plan: 3 months     I discussed the assessment and treatment plan with the patient. The patient was provided an opportunity to ask questions and all were answered. The patient agreed with the plan and demonstrated an understanding of the instructions.   The patient was advised to call back or seek an in-person evaluation if the symptoms worsen or if the condition fails to improve as anticipated.  The above assessment and management plan was discussed with the patient. The patient verbalized understanding of and has agreed to the management plan. Patient is aware to call the clinic if symptoms persist or worsen. Patient is aware when to return to the clinic for a follow-up visit. Patient educated on when it is appropriate to go to the emergency department.   Time call ended:  9:30  I provided 20 minutes of non-face-to-face time during this encounter.    Mary-Margaret Daphine Deutscher, FNP

## 2019-01-04 ENCOUNTER — Other Ambulatory Visit: Payer: Self-pay

## 2019-01-04 ENCOUNTER — Telehealth: Payer: Self-pay | Admitting: Nurse Practitioner

## 2019-01-04 NOTE — Telephone Encounter (Signed)
Changed patients appt to telephone visit and patient notified

## 2019-01-04 NOTE — Telephone Encounter (Signed)
Do you know her? What do you think?

## 2019-01-05 ENCOUNTER — Ambulatory Visit (INDEPENDENT_AMBULATORY_CARE_PROVIDER_SITE_OTHER): Payer: Medicare HMO | Admitting: Nurse Practitioner

## 2019-01-05 ENCOUNTER — Ambulatory Visit: Payer: Medicare HMO | Admitting: Pediatrics

## 2019-01-05 ENCOUNTER — Encounter: Payer: Self-pay | Admitting: Nurse Practitioner

## 2019-01-05 DIAGNOSIS — I10 Essential (primary) hypertension: Secondary | ICD-10-CM | POA: Diagnosis not present

## 2019-01-05 DIAGNOSIS — E669 Obesity, unspecified: Secondary | ICD-10-CM | POA: Diagnosis not present

## 2019-01-05 DIAGNOSIS — K219 Gastro-esophageal reflux disease without esophagitis: Secondary | ICD-10-CM

## 2019-01-05 DIAGNOSIS — E785 Hyperlipidemia, unspecified: Secondary | ICD-10-CM | POA: Diagnosis not present

## 2019-01-05 NOTE — Progress Notes (Signed)
Patient ID: Anita Daniels, female   DOB: 09-12-1935, 83 y.o.   MRN: [id number]    Virtual Visit via telephone Note  I connected with Anita Daniels on 01/05/19 at 10:30 am by telephone and verified that I am speaking with the correct person using two identifiers. Anita Daniels is currently located at HOME and NON ONE is currently with her during visit. The provider, Mary-Margaret Daphine Deutscher, FNP is located in their office at time of visit.  I discussed the limitations, risks, security and privacy concerns of performing an evaluation and management service by telephone and the availability of in person appointments. I also discussed with the patient that there may be a patient responsible charge related to this service. The patient expressed understanding and agreed to proceed.   History and Present Illness:   Chief Complaint: Medical Management of Chronic Issues    HPI:  1. Essential hypertension No c/o chest pain, sob or headaches. Does not chack blood pressure at home. BP Readings from Last 3 Encounters:  07/06/18 129/81  01/19/18 136/80  07/20/17 132/87     2. Hyperlipidemia, unspecified hyperlipidemia type Does not watch diet and does very little exercise.  3. Gastroesophageal reflux disease, esophagitis presence not specified Is on protonix and is working well for her .  4. Obesity (BMI 30-39.9) No recent weight changes    Outpatient Encounter Medications as of 01/05/2019  Medication Sig  . atorvastatin (LIPITOR) 20 MG tablet Take 1 tablet (20 mg total) by mouth daily.  Marland Kitchen FLUAD 0.5 ML SUSY inject 0.5 milliliter intramuscularly  . furosemide (LASIX) 20 MG tablet Take 1 tablet (20 mg total) by mouth daily.  Marland Kitchen lisinopril (ZESTRIL) 20 MG tablet Take 1 tablet (20 mg total) by mouth daily.  . pantoprazole (PROTONIX) 40 MG tablet Take 1 tablet (40 mg total) by mouth daily.      New complaints: None today  Social history: Lives by herself. Has daughter that  checks on her frequantly     Review of Systems  Constitutional: Negative for diaphoresis and weight loss.  Eyes: Negative for blurred vision, double vision and pain.  Respiratory: Negative for shortness of breath.   Cardiovascular: Negative for chest pain, palpitations, orthopnea and leg swelling.  Gastrointestinal: Negative for abdominal pain.  Skin: Negative for rash.  Neurological: Negative for dizziness, sensory change, loss of consciousness, weakness and headaches.  Endo/Heme/Allergies: Negative for polydipsia. Does not bruise/bleed easily.  Psychiatric/Behavioral: Negative for memory loss. The patient does not have insomnia.   All other systems reviewed and are negative.    Observations/Objective: Alert and oriented No distress  Assessment and Plan: Anita Daniels comes in today with chief complaint of Medical Management of Chronic Issues   Diagnosis and orders addressed:  1. Essential hypertension Low sodium diet  2. Hyperlipidemia, unspecified hyperlipidemia type Low fat diet  3. Gastroesophageal reflux disease, esophagitis presence not specified Avoid spicy foods Do not eat 2 hours prior to bedtime  4. Obesity (BMI 30-39.9) Discussed diet and exercise for person with BMI >25 Will recheck weight in 3-6 months    Labs pending Health Maintenance reviewed Diet and exercise encouraged  Follow up plan: 22months     I discussed the assessment and treatment plan with the patient. The patient was provided an opportunity to ask questions and all were answered. The patient agreed with the plan and demonstrated an understanding of the instructions.   The patient was advised to call back or seek an in-person evaluation  if the symptoms worsen or if the condition fails to improve as anticipated.  The above assessment and management plan was discussed with the patient. The patient verbalized understanding of and has agreed to the management plan. Patient is aware  to call the clinic if symptoms persist or worsen. Patient is aware when to return to the clinic for a follow-up visit. Patient educated on when it is appropriate to go to the emergency department.   Time call ended:  10:47  I provided 17 minutes of non-face-to-face time during this encounter.    Mary-Margaret Daphine DeutscherMartin, FNP

## 2019-03-06 ENCOUNTER — Telehealth: Payer: Self-pay | Admitting: Nurse Practitioner

## 2019-03-06 DIAGNOSIS — I10 Essential (primary) hypertension: Secondary | ICD-10-CM

## 2019-03-06 DIAGNOSIS — E785 Hyperlipidemia, unspecified: Secondary | ICD-10-CM

## 2019-03-06 NOTE — Telephone Encounter (Signed)
Labs ordered.

## 2019-03-09 ENCOUNTER — Other Ambulatory Visit (HOSPITAL_COMMUNITY)
Admission: RE | Admit: 2019-03-09 | Discharge: 2019-03-09 | Disposition: A | Discharge status: Home / Self Care | Payer: Medicare HMO | Source: Ambulatory Visit | Attending: Nurse Practitioner | Admitting: Nurse Practitioner

## 2019-03-09 ENCOUNTER — Other Ambulatory Visit: Payer: Self-pay

## 2019-03-09 DIAGNOSIS — E785 Hyperlipidemia, unspecified: Secondary | ICD-10-CM | POA: Diagnosis not present

## 2019-03-09 DIAGNOSIS — I10 Essential (primary) hypertension: Secondary | ICD-10-CM | POA: Insufficient documentation

## 2019-03-09 LAB — LIPID PANEL
Cholesterol: 118 mg/dL (ref 0–200)
HDL: 33 mg/dL — ABNORMAL LOW (ref >40)
LDL Cholesterol: 67 mg/dL (ref 0–99)
Total CHOL/HDL Ratio: 3.6 RATIO
Triglycerides: 89 mg/dL (ref <150)
VLDL: 18 mg/dL (ref 0–40)

## 2019-03-09 LAB — COMPREHENSIVE METABOLIC PANEL
ALT: 22 U/L (ref 0–44)
AST: 26 U/L (ref 15–41)
Albumin: 3.9 g/dL (ref 3.5–5.0)
Alkaline Phosphatase: 93 U/L (ref 38–126)
Anion gap: 6 (ref 5–15)
BUN: 20 mg/dL (ref 8–23)
CO2: 26 mmol/L (ref 22–32)
Calcium: 9 mg/dL (ref 8.9–10.3)
Chloride: 109 mmol/L (ref 98–111)
Creatinine, Ser: 0.9 mg/dL (ref 0.44–1.00)
GFR calc Af Amer: >60 mL/min (ref >60)
GFR calc non Af Amer: 60 mL/min — ABNORMAL LOW (ref >60)
Glucose, Bld: 118 mg/dL — ABNORMAL HIGH (ref 70–99)
Potassium: 3.1 mmol/L — ABNORMAL LOW (ref 3.5–5.1)
Sodium: 141 mmol/L (ref 135–145)
Total Bilirubin: 2.4 mg/dL — ABNORMAL HIGH (ref 0.3–1.2)
Total Protein: 6.7 g/dL (ref 6.5–8.1)

## 2019-03-09 NOTE — Addendum Note (Signed)
Addended by: Earlene Plater on: 03/09/2019 10:56 AM   Modules accepted: Orders

## 2019-03-10 ENCOUNTER — Other Ambulatory Visit: Payer: Self-pay | Admitting: *Deleted

## 2019-03-10 DIAGNOSIS — I10 Essential (primary) hypertension: Secondary | ICD-10-CM

## 2019-03-10 MED ORDER — LISINOPRIL 20 MG PO TABS: 20.0000 mg | ORAL_TABLET | Freq: Every day | ORAL | 1 refills | Status: DC

## 2019-03-12 DIAGNOSIS — Z9114 Patient's other noncompliance with medication regimen: Secondary | ICD-10-CM | POA: Diagnosis not present

## 2019-03-12 DIAGNOSIS — I110 Hypertensive heart disease with heart failure: Secondary | ICD-10-CM | POA: Diagnosis not present

## 2019-03-12 DIAGNOSIS — R0602 Shortness of breath: Secondary | ICD-10-CM | POA: Diagnosis not present

## 2019-03-12 DIAGNOSIS — B9620 Unspecified Escherichia coli [E. coli] as the cause of diseases classified elsewhere: Secondary | ICD-10-CM | POA: Diagnosis not present

## 2019-03-12 DIAGNOSIS — I509 Heart failure, unspecified: Secondary | ICD-10-CM | POA: Diagnosis not present

## 2019-03-12 DIAGNOSIS — I361 Nonrheumatic tricuspid (valve) insufficiency: Secondary | ICD-10-CM | POA: Diagnosis not present

## 2019-03-12 DIAGNOSIS — N390 Urinary tract infection, site not specified: Secondary | ICD-10-CM | POA: Diagnosis not present

## 2019-03-12 DIAGNOSIS — I5021 Acute systolic (congestive) heart failure: Secondary | ICD-10-CM | POA: Diagnosis not present

## 2019-03-12 DIAGNOSIS — E7800 Pure hypercholesterolemia, unspecified: Secondary | ICD-10-CM | POA: Diagnosis not present

## 2019-03-12 DIAGNOSIS — I10 Essential (primary) hypertension: Secondary | ICD-10-CM | POA: Diagnosis not present

## 2019-03-12 DIAGNOSIS — I4891 Unspecified atrial fibrillation: Secondary | ICD-10-CM | POA: Diagnosis not present

## 2019-03-12 DIAGNOSIS — R0600 Dyspnea, unspecified: Secondary | ICD-10-CM | POA: Diagnosis not present

## 2019-03-12 DIAGNOSIS — I4510 Unspecified right bundle-branch block: Secondary | ICD-10-CM | POA: Diagnosis not present

## 2019-03-12 DIAGNOSIS — I340 Nonrheumatic mitral (valve) insufficiency: Secondary | ICD-10-CM | POA: Diagnosis not present

## 2019-03-12 DIAGNOSIS — I351 Nonrheumatic aortic (valve) insufficiency: Secondary | ICD-10-CM | POA: Diagnosis not present

## 2019-03-14 DIAGNOSIS — I361 Nonrheumatic tricuspid (valve) insufficiency: Secondary | ICD-10-CM | POA: Diagnosis not present

## 2019-03-14 DIAGNOSIS — I4891 Unspecified atrial fibrillation: Secondary | ICD-10-CM | POA: Diagnosis not present

## 2019-03-14 DIAGNOSIS — I351 Nonrheumatic aortic (valve) insufficiency: Secondary | ICD-10-CM | POA: Diagnosis not present

## 2019-03-14 DIAGNOSIS — I340 Nonrheumatic mitral (valve) insufficiency: Secondary | ICD-10-CM | POA: Diagnosis not present

## 2019-03-15 ENCOUNTER — Telehealth: Payer: Self-pay | Admitting: Nurse Practitioner

## 2019-03-15 NOTE — Telephone Encounter (Signed)
hosp f/u made  CHF

## 2019-03-16 ENCOUNTER — Ambulatory Visit (INDEPENDENT_AMBULATORY_CARE_PROVIDER_SITE_OTHER): Payer: Medicare HMO | Admitting: Nurse Practitioner

## 2019-03-16 ENCOUNTER — Other Ambulatory Visit: Payer: Self-pay

## 2019-03-16 ENCOUNTER — Encounter: Payer: Self-pay | Admitting: Nurse Practitioner

## 2019-03-16 VITALS — BP 138/66 | HR 75 | Temp 97.2°F | Ht 71.0 in | Wt 160.0 lb

## 2019-03-16 DIAGNOSIS — I4811 Longstanding persistent atrial fibrillation: Secondary | ICD-10-CM | POA: Insufficient documentation

## 2019-03-16 DIAGNOSIS — E876 Hypokalemia: Secondary | ICD-10-CM | POA: Diagnosis not present

## 2019-03-16 DIAGNOSIS — F419 Anxiety disorder, unspecified: Secondary | ICD-10-CM | POA: Diagnosis not present

## 2019-03-16 DIAGNOSIS — R609 Edema, unspecified: Secondary | ICD-10-CM

## 2019-03-16 DIAGNOSIS — R69 Illness, unspecified: Secondary | ICD-10-CM | POA: Diagnosis not present

## 2019-03-16 HISTORY — DX: Anxiety disorder, unspecified: F41.9

## 2019-03-16 MED ORDER — FUROSEMIDE 20 MG PO TABS: 20.0000 mg | ORAL_TABLET | Freq: Every day | ORAL | 1 refills | Status: DC

## 2019-03-16 MED ORDER — METOPROLOL TARTRATE 25 MG PO TABS: 25.0000 mg | ORAL_TABLET | Freq: Two times a day (BID) | ORAL | 1 refills | Status: DC

## 2019-03-16 MED ORDER — APIXABAN 2.5 MG PO TABS: 2.5000 mg | ORAL_TABLET | Freq: Two times a day (BID) | ORAL | 1 refills | Status: DC

## 2019-03-16 MED ORDER — ALPRAZOLAM 0.25 MG PO TABS: 0.2500 mg | ORAL_TABLET | Freq: Two times a day (BID) | ORAL | 0 refills | Status: DC | PRN

## 2019-03-16 MED ORDER — CITALOPRAM HYDROBROMIDE 20 MG PO TABS: 20.0000 mg | ORAL_TABLET | Freq: Every day | ORAL | 5 refills | Status: DC

## 2019-03-16 NOTE — Progress Notes (Signed)
   Subjective:    Patient ID: Anita Daniels, female    DOB: 19-Jul-1936, 83 y.o.   MRN: [id number]   Chief Complaint: Hospitalization Follow-up   HPI Patient comes in today for hospital follow up. She went to the hospital this past Sunday for SOB. She was taken to [facility] When she got to the ER they diagnosed her with atrial Fib. They did echo cardiogram which showed right atrial enlargement. She was started on metroprolol and eliquis. She is still in atrial fib and for now they are just going to treat orally. She is to see Dr. Denman George for cardiology follow up in September. Today she is feeling well other then anxiety and irritation. Her daughter says she is still having majors SOB. Her anxiety has the best of her right now.    Review of Systems  Constitutional: Negative.   Respiratory: Positive for shortness of breath. Negative for cough.   Cardiovascular: Positive for leg swelling. Negative for chest pain and palpitations.  Gastrointestinal: Negative.   Musculoskeletal: Negative.   Neurological: Negative.   Psychiatric/Behavioral: Negative.   All other systems reviewed and are negative.      Objective:   Physical Exam Vitals signs and nursing note reviewed.  Constitutional:      Appearance: Normal appearance.  Cardiovascular:     Rate and Rhythm: Normal rate. Rhythm irregular.     Heart sounds: Normal heart sounds.  Pulmonary:     Breath sounds: Normal breath sounds.  Skin:    General: Skin is warm.  Neurological:     General: No focal deficit present.     Mental Status: She is alert and oriented to person, place, and time.     BP 138/66   Pulse 75   Temp (!) 97.2 F (36.2 C) (Oral)   Ht '5\' 11"'$  (1.803 m)   Wt 160 lb (72.6 kg)   SpO2 96%   BMI 22.32 kg/m        Assessment & Plan:  [name] comes in today with chief complaint of Hospitalization Follow-up   Diagnosis and orders addressed:  1. Longstanding persistent atrial fibrillation  Limit caffeine - apixaban (ELIQUIS) 2.5 MG TABS tablet; Take 1 tablet (2.5 mg total) by mouth 2 (two) times daily.  Dispense: 180 tablet; Refill: 1 - metoprolol tartrate (LOPRESSOR) 25 MG tablet; Take 1 tablet (25 mg total) by mouth 2 (two) times daily.  Dispense: 180 tablet; Refill: 1  2. Hypokalemia Labs pending - CMP14+EGFR  3. Anxiety Xanax 0.25 1 po q6 prn- only until celexa starts wroking - citalopram (CELEXA) 20 MG tablet; Take 1 tablet (20 mg total) by mouth daily.  Dispense: 30 tablet; Refill: 5  4. Peripheral edema Daily weights -report weight gain of 2lbs or greater in 24 hours - furosemide (LASIX) 20 MG tablet; Take 1 tablet (20 mg total) by mouth daily.  Dispense: 90 tablet; Refill: 1   Labs pending Health Maintenance reviewed Diet and exercise encouraged  Follow up plan: 1 month   [place], FNP

## 2019-03-16 NOTE — Patient Instructions (Signed)
Edema  Edema is when you have too much fluid in your body or under your skin. Edema may make your legs, feet, and ankles swell up. Swelling is also common in looser tissues, like around your eyes. This is a common condition. It gets more common as you get older. There are many possible causes of edema. Eating too much salt (sodium) and being on your feet or sitting for a long time can cause edema in your legs, feet, and ankles. Hot weather may make edema worse. Edema is usually painless. Your skin may look swollen or shiny. Follow these instructions at home:  Keep the swollen body part raised (elevated) above the level of your heart when you are sitting or lying down.  Do not sit still or stand for a long time.  Do not wear tight clothes. Do not wear garters on your upper legs.  Exercise your legs. This can help the swelling go down.  Wear elastic bandages or support stockings as told by your doctor.  Eat a low-salt (low-sodium) diet to reduce fluid as told by your doctor.  Depending on the cause of your swelling, you may need to limit how much fluid you drink (fluid restriction).  Take over-the-counter and prescription medicines only as told by your doctor. Contact a doctor if:  Treatment is not working.  You have heart, liver, or kidney disease and have symptoms of edema.  You have sudden and unexplained weight gain. Get help right away if:  You have shortness of breath or chest pain.  You cannot breathe when you lie down.  You have pain, redness, or warmth in the swollen areas.  You have heart, liver, or kidney disease and get edema all of a sudden.  You have a fever and your symptoms get worse all of a sudden. Summary  Edema is when you have too much fluid in your body or under your skin.  Edema may make your legs, feet, and ankles swell up. Swelling is also common in looser tissues, like around your eyes.  Raise (elevate) the swollen body part above the level of your  heart when you are sitting or lying down.  Follow your doctor's instructions about diet and how much fluid you can drink (fluid restriction). This information is not intended to replace advice given to you by your health care provider. Make sure you discuss any questions you have with your health care provider. Document Released: 01/06/2008 Document Revised: 07/23/2017 Document Reviewed: 08/07/2016 Elsevier Patient Education  2020 Elsevier Inc.  

## 2019-03-17 ENCOUNTER — Telehealth: Payer: Self-pay | Admitting: Nurse Practitioner

## 2019-03-17 LAB — CMP14+EGFR
ALT: 15 IU/L (ref 0–32)
AST: 30 IU/L (ref 0–40)
Albumin/Globulin Ratio: 1.7 (ref 1.2–2.2)
Albumin: 4 g/dL (ref 3.6–4.6)
Alkaline Phosphatase: 97 IU/L (ref 39–117)
BUN/Creatinine Ratio: 19 (ref 12–28)
BUN: 21 mg/dL (ref 8–27)
Bilirubin Total: 2 mg/dL — ABNORMAL HIGH (ref 0.0–1.2)
CO2: 25 mmol/L (ref 20–29)
Calcium: 9.2 mg/dL (ref 8.7–10.3)
Chloride: 101 mmol/L (ref 96–106)
Creatinine, Ser: 1.1 mg/dL — ABNORMAL HIGH (ref 0.57–1.00)
GFR calc Af Amer: 54 mL/min/{1.73_m2} — ABNORMAL LOW (ref >59)
GFR calc non Af Amer: 47 mL/min/{1.73_m2} — ABNORMAL LOW (ref >59)
Globulin, Total: 2.4 g/dL (ref 1.5–4.5)
Glucose: 108 mg/dL — ABNORMAL HIGH (ref 65–99)
Potassium: 4 mmol/L (ref 3.5–5.2)
Sodium: 145 mmol/L — ABNORMAL HIGH (ref 134–144)
Total Protein: 6.4 g/dL (ref 6.0–8.5)

## 2019-03-17 NOTE — Telephone Encounter (Signed)
Aware of results and recommendations  

## 2019-03-20 DIAGNOSIS — R55 Syncope and collapse: Secondary | ICD-10-CM | POA: Diagnosis not present

## 2019-03-20 DIAGNOSIS — E7800 Pure hypercholesterolemia, unspecified: Secondary | ICD-10-CM | POA: Diagnosis not present

## 2019-03-20 DIAGNOSIS — K219 Gastro-esophageal reflux disease without esophagitis: Secondary | ICD-10-CM | POA: Diagnosis not present

## 2019-03-20 DIAGNOSIS — R200 Anesthesia of skin: Secondary | ICD-10-CM | POA: Diagnosis not present

## 2019-03-20 DIAGNOSIS — I517 Cardiomegaly: Secondary | ICD-10-CM | POA: Diagnosis not present

## 2019-03-20 DIAGNOSIS — I110 Hypertensive heart disease with heart failure: Secondary | ICD-10-CM | POA: Diagnosis not present

## 2019-03-20 DIAGNOSIS — I4891 Unspecified atrial fibrillation: Secondary | ICD-10-CM | POA: Diagnosis not present

## 2019-03-20 DIAGNOSIS — Z79899 Other long term (current) drug therapy: Secondary | ICD-10-CM | POA: Diagnosis not present

## 2019-03-20 DIAGNOSIS — I509 Heart failure, unspecified: Secondary | ICD-10-CM | POA: Diagnosis not present

## 2019-03-20 DIAGNOSIS — Z7901 Long term (current) use of anticoagulants: Secondary | ICD-10-CM | POA: Diagnosis not present

## 2019-03-20 DIAGNOSIS — R42 Dizziness and giddiness: Secondary | ICD-10-CM | POA: Diagnosis not present

## 2019-03-22 DIAGNOSIS — Z029 Encounter for administrative examinations, unspecified: Secondary | ICD-10-CM

## 2019-03-23 ENCOUNTER — Other Ambulatory Visit: Payer: Self-pay

## 2019-03-23 DIAGNOSIS — Z0289 Encounter for other administrative examinations: Secondary | ICD-10-CM

## 2019-03-24 ENCOUNTER — Ambulatory Visit (INDEPENDENT_AMBULATORY_CARE_PROVIDER_SITE_OTHER): Payer: Medicare HMO | Admitting: Nurse Practitioner

## 2019-03-24 ENCOUNTER — Encounter: Payer: Self-pay | Admitting: Nurse Practitioner

## 2019-03-24 VITALS — BP 102/77 | HR 79 | Temp 96.9°F

## 2019-03-24 DIAGNOSIS — Z09 Encounter for follow-up examination after completed treatment for conditions other than malignant neoplasm: Secondary | ICD-10-CM

## 2019-03-24 DIAGNOSIS — R202 Paresthesia of skin: Secondary | ICD-10-CM

## 2019-03-24 NOTE — Patient Instructions (Signed)
Vitamin B12 Deficiency Vitamin B12 deficiency means that your body does not have enough vitamin B12. The body needs this vitamin:  To make red blood cells.  To make genes (DNA).  To help the nerves work. If you do not have enough vitamin B12 in your body, you can have health problems. What are the causes?  Not eating enough foods that contain vitamin B12.  Not being able to absorb vitamin B12 from the food that you eat.  Certain digestive system diseases.  A condition in which the body does not make enough of a certain protein, which results in too few red blood cells (pernicious anemia).  Having a surgery in which part of the stomach or small intestine is removed.  Taking medicines that make it hard for the body to absorb vitamin B12. These medicines include: ? Heartburn medicines. ? Some antibiotic medicines. ? Other medicines that are used to treat certain conditions. What increases the risk?  Being older than age 50.  Eating a vegetarian or vegan diet, especially while you are pregnant.  Eating a poor diet while you are pregnant.  Taking certain medicines.  Having alcoholism. What are the signs or symptoms? In some cases, there are no symptoms. If the condition leads to too few blood cells or nerve damage, symptoms can occur, such as:  Feeling weak.  Feeling tired (fatigued).  Not being hungry.  Weight loss.  A loss of feeling (numbness) or tingling in your hands and feet.  Redness and burning of the tongue.  Being mixed up (confused) or having memory problems.  Sadness (depression).  Problems with your senses. This can include color blindness, ringing in the ears, or loss of taste.  Watery poop (diarrhea) or trouble pooping (constipation).  Trouble walking. If anemia is very bad, symptoms can include:  Being short of breath.  Being dizzy.  Having a very fast heartbeat. How is this treated?  Changing the way you eat and drink, such as: ?  Eating more foods that contain vitamin B12. ? Drinking little or no alcohol.  Getting vitamin B12 shots.  Taking vitamin B12 supplements. Your doctor will tell you the dose that is best for you. Follow these instructions at home: Eating and drinking   Eat lots of healthy foods that contain vitamin B12. These include: ? Meats and poultry, such as beef, pork, chicken, turkey, and organ meats, such as liver. ? Seafood, such as clams, rainbow trout, salmon, tuna, and haddock. ? Eggs. ? Cereal and dairy products that have vitamin B12 added to them. Check the label. The items listed above may not be a complete list of what you can eat and drink. Contact a dietitian for more options. General instructions  Get any shots as told by your doctor.  Take supplements only as told by your doctor.  Do not drink alcohol if your doctor tells you not to. In some cases, you may only be asked to limit alcohol use.  Keep all follow-up visits as told by your doctor. This is important. Contact a doctor if:  Your symptoms come back. Get help right away if:  You have trouble breathing.  You have a very fast heartbeat.  You have chest pain.  You get dizzy.  You pass out. Summary  Vitamin B12 deficiency means that your body is not getting enough vitamin B12.  In some cases, there are no symptoms of this condition.  Treatment may include making a change in the way you eat and drink,   getting vitamin B12 shots, or taking supplements.  Eat lots of healthy foods that contain vitamin B12. This information is not intended to replace advice given to you by your health care provider. Make sure you discuss any questions you have with your health care provider. Document Released: 07/09/2011 Document Revised: 03/29/2018 Document Reviewed: 03/29/2018 Elsevier Patient Education  2020 Elsevier Inc.  

## 2019-03-24 NOTE — Progress Notes (Signed)
Subjective:    Patient ID: Anita Daniels, female    DOB: 30-Apr-1936, 83 y.o.   MRN: [id number]   Chief Complaint: Hospitalization Follow-up [facility] x 1 week ago - Bilateral feet tingling and left hand. Low bp )   HPI Patient said that she went to [facility] c/o lower ext tingling bil and hands tingling. They did a scan and was negative. They said her blood pressure was low and they decreased her lisinopril from 73m to 160mand decreased metropolol to 12.5 bid and started her on magensium. Her only complaint oday is her left hand is still tingling some but is some better.    Review of Systems  Constitutional: Negative for activity change and appetite change.  HENT: Negative.   Eyes: Negative for pain.  Respiratory: Negative for shortness of breath.   Cardiovascular: Negative for chest pain, palpitations and leg swelling.  Gastrointestinal: Negative for abdominal pain.  Endocrine: Negative for polydipsia.  Genitourinary: Negative.   Skin: Negative for rash.  Neurological: Positive for numbness (left hand). Negative for dizziness, weakness and headaches.  Hematological: Does not bruise/bleed easily.  Psychiatric/Behavioral: Negative.   All other systems reviewed and are negative.      Objective:   Physical Exam Vitals signs and nursing note reviewed.  Constitutional:      General: She is not in acute distress.    Appearance: Normal appearance. She is well-developed.  HENT:     Head: Normocephalic.     Nose: Nose normal.  Eyes:     Pupils: Pupils are equal, round, and reactive to light.  Neck:     Musculoskeletal: Normal range of motion and neck supple.     Vascular: No carotid bruit or JVD.  Cardiovascular:     Rate and Rhythm: Normal rate. Rhythm irregular.     Heart sounds: Normal heart sounds.  Pulmonary:     Effort: Pulmonary effort is normal. No respiratory distress.     Breath sounds: Normal breath sounds. No wheezing or rales.  Chest:     Chest  wall: No tenderness.  Abdominal:     General: Bowel sounds are normal. There is no distension or abdominal bruit.     Palpations: Abdomen is soft. There is no hepatomegaly, splenomegaly, mass or pulsatile mass.     Tenderness: There is no abdominal tenderness.  Musculoskeletal: Normal range of motion.     Right lower leg: Edema (1=) present.     Left lower leg: Edema (1+) present.     Comments: Grips equal bil  Lymphadenopathy:     Cervical: No cervical adenopathy.  Skin:    General: Skin is warm and dry.  Neurological:     Mental Status: She is alert and oriented to person, place, and time.     Deep Tendon Reflexes: Reflexes are normal and symmetric.  Psychiatric:        Behavior: Behavior normal.        Thought Content: Thought content normal.        Judgment: Judgment normal.    BP 102/77   Pulse 79   Temp (!) 96.9 F (36.1 C) (Temporal)         Assessment & Plan:  BeTerriah Reggion today with chief complaint of [facility] Bilateral feet tingling and left hand. Low bp )   1. Left hand paresthesia Labs pending - CMP14+EGFR - Vitamin B12 - Magnesium  2. Hospital discharge follow-up  Reviewed discharged records brought in by patient Scheduled carotid doppler study  [place], FNP

## 2019-03-27 LAB — CMP14+EGFR
ALT: 19 IU/L (ref 0–32)
AST: 29 IU/L (ref 0–40)
Albumin/Globulin Ratio: 1.5 (ref 1.2–2.2)
Albumin: 4 g/dL (ref 3.6–4.6)
Alkaline Phosphatase: 89 IU/L (ref 39–117)
BUN/Creatinine Ratio: 16 (ref 12–28)
BUN: 16 mg/dL (ref 8–27)
Bilirubin Total: 1.5 mg/dL — ABNORMAL HIGH (ref 0.0–1.2)
CO2: 24 mmol/L (ref 20–29)
Calcium: 9 mg/dL (ref 8.7–10.3)
Chloride: 100 mmol/L (ref 96–106)
Creatinine, Ser: 0.98 mg/dL (ref 0.57–1.00)
GFR calc Af Amer: 63 mL/min/{1.73_m2} (ref >59)
GFR calc non Af Amer: 54 mL/min/{1.73_m2} — ABNORMAL LOW (ref >59)
Globulin, Total: 2.6 g/dL (ref 1.5–4.5)
Glucose: 105 mg/dL — ABNORMAL HIGH (ref 65–99)
Potassium: 4.6 mmol/L (ref 3.5–5.2)
Sodium: 141 mmol/L (ref 134–144)
Total Protein: 6.6 g/dL (ref 6.0–8.5)

## 2019-03-27 LAB — MAGNESIUM: Magnesium: 1.9 mg/dL (ref 1.6–2.3)

## 2019-03-27 LAB — VITAMIN B12

## 2019-03-29 DIAGNOSIS — I5042 Chronic combined systolic (congestive) and diastolic (congestive) heart failure: Secondary | ICD-10-CM | POA: Insufficient documentation

## 2019-03-29 DIAGNOSIS — I5022 Chronic systolic (congestive) heart failure: Secondary | ICD-10-CM | POA: Insufficient documentation

## 2019-03-29 HISTORY — DX: Chronic combined systolic (congestive) and diastolic (congestive) heart failure: I50.42

## 2019-03-30 ENCOUNTER — Other Ambulatory Visit: Payer: Self-pay | Admitting: Nurse Practitioner

## 2019-03-30 DIAGNOSIS — I10 Essential (primary) hypertension: Secondary | ICD-10-CM | POA: Diagnosis not present

## 2019-03-30 DIAGNOSIS — I480 Paroxysmal atrial fibrillation: Secondary | ICD-10-CM | POA: Diagnosis not present

## 2019-03-30 DIAGNOSIS — E785 Hyperlipidemia, unspecified: Secondary | ICD-10-CM | POA: Diagnosis not present

## 2019-03-30 DIAGNOSIS — I4819 Other persistent atrial fibrillation: Secondary | ICD-10-CM | POA: Diagnosis not present

## 2019-03-30 DIAGNOSIS — I5022 Chronic systolic (congestive) heart failure: Secondary | ICD-10-CM | POA: Diagnosis not present

## 2019-03-30 DIAGNOSIS — R609 Edema, unspecified: Secondary | ICD-10-CM

## 2019-03-31 ENCOUNTER — Other Ambulatory Visit: Payer: Self-pay

## 2019-03-31 ENCOUNTER — Other Ambulatory Visit: Payer: Medicare HMO

## 2019-03-31 DIAGNOSIS — R5383 Other fatigue: Secondary | ICD-10-CM

## 2019-03-31 DIAGNOSIS — R6889 Other general symptoms and signs: Secondary | ICD-10-CM | POA: Diagnosis not present

## 2019-04-01 LAB — VITAMIN B12: Vitamin B-12: 1083 pg/mL (ref 232–1245)

## 2019-04-03 ENCOUNTER — Ambulatory Visit (HOSPITAL_COMMUNITY): Payer: Medicare HMO

## 2019-04-03 ENCOUNTER — Other Ambulatory Visit: Payer: Self-pay | Admitting: *Deleted

## 2019-04-03 MED ORDER — MAGNESIUM OXIDE -MG SUPPLEMENT 400 (240 MG) MG PO TABS: ORAL_TABLET | ORAL | 3 refills | Status: DC

## 2019-04-06 ENCOUNTER — Other Ambulatory Visit: Payer: Self-pay

## 2019-04-06 ENCOUNTER — Ambulatory Visit (HOSPITAL_COMMUNITY)
Admission: RE | Admit: 2019-04-06 | Discharge: 2019-04-06 | Disposition: A | Discharge status: Home / Self Care | Payer: Medicare HMO | Source: Ambulatory Visit | Attending: Nurse Practitioner | Admitting: Nurse Practitioner

## 2019-04-06 DIAGNOSIS — R202 Paresthesia of skin: Secondary | ICD-10-CM | POA: Diagnosis not present

## 2019-04-17 ENCOUNTER — Other Ambulatory Visit: Payer: Self-pay

## 2019-04-18 ENCOUNTER — Encounter: Payer: Self-pay | Admitting: Nurse Practitioner

## 2019-04-18 ENCOUNTER — Telehealth: Payer: Self-pay | Admitting: Nurse Practitioner

## 2019-04-18 ENCOUNTER — Ambulatory Visit (INDEPENDENT_AMBULATORY_CARE_PROVIDER_SITE_OTHER): Payer: Medicare HMO | Admitting: Nurse Practitioner

## 2019-04-18 VITALS — BP 109/83 | HR 97 | Temp 98.2°F | Ht 71.0 in | Wt 254.0 lb

## 2019-04-18 DIAGNOSIS — E785 Hyperlipidemia, unspecified: Secondary | ICD-10-CM

## 2019-04-18 DIAGNOSIS — K219 Gastro-esophageal reflux disease without esophagitis: Secondary | ICD-10-CM | POA: Diagnosis not present

## 2019-04-18 DIAGNOSIS — Z23 Encounter for immunization: Secondary | ICD-10-CM

## 2019-04-18 DIAGNOSIS — F419 Anxiety disorder, unspecified: Secondary | ICD-10-CM

## 2019-04-18 DIAGNOSIS — E669 Obesity, unspecified: Secondary | ICD-10-CM | POA: Diagnosis not present

## 2019-04-18 DIAGNOSIS — I10 Essential (primary) hypertension: Secondary | ICD-10-CM | POA: Diagnosis not present

## 2019-04-18 DIAGNOSIS — I4811 Longstanding persistent atrial fibrillation: Secondary | ICD-10-CM

## 2019-04-18 DIAGNOSIS — R69 Illness, unspecified: Secondary | ICD-10-CM | POA: Diagnosis not present

## 2019-04-18 DIAGNOSIS — R4789 Other speech disturbances: Secondary | ICD-10-CM

## 2019-04-18 MED ORDER — ALPRAZOLAM 0.25 MG PO TABS: 0.2500 mg | ORAL_TABLET | Freq: Two times a day (BID) | ORAL | 1 refills | Status: DC | PRN

## 2019-04-18 NOTE — Patient Instructions (Signed)

## 2019-04-18 NOTE — Addendum Note (Signed)
Addended by: Chevis Pretty on: 04/18/2019 02:18 PM   Modules accepted: Orders

## 2019-04-18 NOTE — Progress Notes (Signed)
Subjective:    Patient ID: Anita Daniels, female    DOB: 1936-04-05, 83 y.o.   MRN: [id number]   Chief Complaint: Medical Management of Chronic Issues    HPI:  1. Essential hypertension No c/o chest pain, sob or headaches. She  not have blood pressure checked at home every morning. Systolic ranges from 08/676.  BP Readings from Last 3 Encounters:  03/24/19 102/77  03/16/19 138/66  07/06/18 129/81    2. Hyperlipidemia, unspecified hyperlipidemia type Does not watch diet and does very little exercise. Lab Results  Component Value Date   CHOL 118 03/09/2019   HDL 33 (L) 03/09/2019   LDLCALC 67 03/09/2019   TRIG 89 03/09/2019   CHOLHDL 3.6 03/09/2019     3. Gastroesophageal reflux disease, esophagitis presence not specified Is on a daily dose of protonix. Says she has had no recent symptoms.  4. Anxiety Is on celexa daily and is doing well. GAD 7 : Generalized Anxiety Score 04/18/2019  Nervous, Anxious, on Edge 1  Control/stop worrying 1  Worry too much - different things 1  Trouble relaxing 0  Restless 0  Easily annoyed or irritable 0  Afraid - awful might happen 0  Total GAD 7 Score 3      5. Longstanding persistent atrial fibrillation Is on eliquis daily. Saw cardiology on 03/30/19 and she is suppose to follow up this week to possibly start amiodarone. They reduced her lisinoprill to 5mg  daily.  6. Obesity (BMI 30-39.9) Weight is down 6lbs since last visit Wt Readings from Last 3 Encounters:  04/18/19 254 lb (115.2 kg)  03/16/19 260 lb (72.6 kg)  07/06/18 277 lb 6.4 oz (125.8 kg)   BMI Readings from Last 3 Encounters:  04/18/19 35.43 kg/m  03/16/19 22.32 kg/m  07/06/18 38.69 kg/m        Outpatient Encounter Medications as of 04/18/2019  Medication Sig  . ALPRAZolam (XANAX) 0.25 MG tablet Take 1 tablet (0.25 mg total) by mouth 2 (two) times daily as needed for anxiety.  Marland Kitchen apixaban (ELIQUIS) 2.5 MG TABS tablet Take 1 tablet (2.5 mg total) by  mouth 2 (two) times daily.  Marland Kitchen atorvastatin (LIPITOR) 20 MG tablet Take 1 tablet (20 mg total) by mouth daily.  . citalopram (CELEXA) 20 MG tablet Take 1 tablet (20 mg total) by mouth daily.  . furosemide (LASIX) 20 MG tablet TAKE 1 TABLET BY MOUTH EVERY DAY  . lisinopril (ZESTRIL) 10 MG tablet Take 10 mg by mouth daily.  . Magnesium Oxide 400 (240 Mg) MG TABS TK 1 T PO D  . metoprolol tartrate (LOPRESSOR) 12.5 mg TABS tablet Take 12.5 mg by mouth 2 (two) times daily.  . pantoprazole (PROTONIX) 40 MG tablet Take 1 tablet (40 mg total) by mouth daily.     Past Surgical History:  Procedure Laterality Date  . PARTIAL KNEE ARTHROPLASTY      History reviewed. No pertinent family history.  New complaints: Having trouble with getting the right words out. She does not call things by right names. Her daughter says that this is happening often and more then it use to. Repeat questions several times a day.  Social history: Lives by herself  Controlled substance contract: 04/18/19    Review of Systems  Constitutional: Negative for activity change and appetite change.  HENT: Negative.   Eyes: Negative for pain.  Respiratory: Negative for shortness of breath.   Cardiovascular: Negative for chest pain, palpitations and leg swelling.  Gastrointestinal: Negative for  abdominal pain.  Endocrine: Negative for polydipsia.  Genitourinary: Negative.   Skin: Negative for rash.  Neurological: Negative for dizziness, weakness and headaches.  Hematological: Does not bruise/bleed easily.  Psychiatric/Behavioral: Negative.   All other systems reviewed and are negative.      Objective:   Physical Exam Vitals signs and nursing note reviewed. Exam conducted with a chaperone present.  Constitutional:      General: She is not in acute distress.    Appearance: Normal appearance. She is well-developed.  HENT:     Head: Normocephalic.     Nose: Nose normal.  Eyes:     Pupils: Pupils are equal, round,  and reactive to light.  Neck:     Musculoskeletal: Normal range of motion and neck supple.     Vascular: No carotid bruit or JVD.  Cardiovascular:     Rate and Rhythm: Normal rate. Rhythm irregular.     Heart sounds: Normal heart sounds.  Pulmonary:     Effort: Pulmonary effort is normal. No respiratory distress.     Breath sounds: Normal breath sounds. No wheezing or rales.  Chest:     Chest wall: No tenderness.  Abdominal:     General: Bowel sounds are normal. There is no distension or abdominal bruit.     Palpations: Abdomen is soft. There is no hepatomegaly, splenomegaly, mass or pulsatile mass.     Tenderness: There is no abdominal tenderness.  Musculoskeletal: Normal range of motion.     Comments: Ambulating with cane today.  Lymphadenopathy:     Cervical: No cervical adenopathy.  Skin:    General: Skin is warm and dry.  Neurological:     Mental Status: She is alert and oriented to person, place, and time.     Deep Tendon Reflexes: Reflexes are normal and symmetric.  Psychiatric:        Behavior: Behavior normal.        Thought Content: Thought content normal.        Judgment: Judgment normal.    BP 109/83   Pulse 97   Temp 98.2 F (36.8 C) (Oral)   Ht 5\' 11"  (1.803 m)   Wt 254 lb (115.2 kg)   SpO2 96%   BMI 35.43 kg/m         Assessment & Plan:  Anita Daniels comes in today with chief complaint of Medical Management of Chronic Issues   Diagnosis and orders addressed:  1. Essential hypertension Hold lisinopril for now due to low blood pressure readings  2. Hyperlipidemia, unspecified hyperlipidemia type Low fat diet encouraged  3. Gastroesophageal reflux disease, esophagitis presence not specified Avoid spicy foods Do not eat 2 hours prior to bedtime  4. Anxiety Stress management  5. Longstanding persistent atrial fibrillation Keep follow up appointment with cardiology  6. Obesity (BMI 30-39.9) Discussed diet and exercise for person with  BMI >25 Will recheck weight in 3-6 months  7. Word finding problem Wants to wait until after she starts on amiodarone before we do neuro referral   Labs pending Health Maintenance reviewed Diet and exercise encouraged  Follow up plan: 3 months   Anita Daphine DeutscherMartin, FNP

## 2019-04-19 ENCOUNTER — Telehealth: Payer: Self-pay | Admitting: Nurse Practitioner

## 2019-04-19 MED ORDER — ALPRAZOLAM 0.25 MG PO TABS: 0.2500 mg | ORAL_TABLET | Freq: Two times a day (BID) | ORAL | 1 refills | Status: DC | PRN

## 2019-04-19 NOTE — Telephone Encounter (Signed)
Patient states that she has received prescription

## 2019-04-19 NOTE — Telephone Encounter (Signed)
xanx rxsnet to [facility]- cancel mail order please

## 2019-04-19 NOTE — Telephone Encounter (Signed)
Patient aware.

## 2019-04-20 DIAGNOSIS — I10 Essential (primary) hypertension: Secondary | ICD-10-CM | POA: Diagnosis not present

## 2019-04-20 DIAGNOSIS — I4819 Other persistent atrial fibrillation: Secondary | ICD-10-CM | POA: Diagnosis not present

## 2019-04-20 DIAGNOSIS — I509 Heart failure, unspecified: Secondary | ICD-10-CM | POA: Diagnosis not present

## 2019-05-01 ENCOUNTER — Telehealth: Payer: Self-pay | Admitting: Nurse Practitioner

## 2019-05-01 DIAGNOSIS — F419 Anxiety disorder, unspecified: Secondary | ICD-10-CM

## 2019-05-01 DIAGNOSIS — R609 Edema, unspecified: Secondary | ICD-10-CM

## 2019-05-01 MED ORDER — CITALOPRAM HYDROBROMIDE 20 MG PO TABS: 20.0000 mg | ORAL_TABLET | Freq: Every day | ORAL | 1 refills | Status: DC

## 2019-05-01 MED ORDER — FUROSEMIDE 20 MG PO TABS: 20.0000 mg | ORAL_TABLET | Freq: Every day | ORAL | 1 refills | Status: DC

## 2019-05-01 NOTE — Telephone Encounter (Signed)
Patient requested for her celexa and lasix to be sent in for a 90 day supply- rx sent.  Patient aware that per MMM note on 9/15 she was to hold the lisinopril due to low bp readings.

## 2019-05-11 ENCOUNTER — Telehealth: Payer: Self-pay | Admitting: Nurse Practitioner

## 2019-05-11 NOTE — Telephone Encounter (Signed)
That is ok 

## 2019-05-16 ENCOUNTER — Ambulatory Visit (INDEPENDENT_AMBULATORY_CARE_PROVIDER_SITE_OTHER): Payer: Medicare HMO | Admitting: Family Medicine

## 2019-05-16 ENCOUNTER — Encounter: Payer: Self-pay | Admitting: Family Medicine

## 2019-05-16 DIAGNOSIS — R200 Anesthesia of skin: Secondary | ICD-10-CM

## 2019-05-16 NOTE — Progress Notes (Signed)
Virtual Visit via Telephone Note  I connected with Anita Daniels on 05/16/19 at 9:27 AM by telephone and verified that I am speaking with the correct person using two identifiers. Anita Daniels is currently located at home and nobody is currently with her during this visit. The provider, Loman Brooklyn, FNP is located in their home at time of visit.  I discussed the limitations, risks, security and privacy concerns of performing an evaluation and management service by telephone and the availability of in person appointments. I also discussed with the patient that there may be a patient responsible charge related to this service. The patient expressed understanding and agreed to proceed.  Subjective: PCP: Chevis Pretty, FNP  Chief Complaint  Patient presents with   Numbness   Patient c/o numbness in bilateral legs that started yesterday. She reports she feels this sensation only on the front of her legs from the ankle to the knee. She is taking Magnesium due to numbness in her fingertips. She does have a new prescription for amiodarone. States she is just calling today to make sure this isn't life threatening. She ate some potassium and it started to feel a little better.    ROS: Per HPI  Current Outpatient Medications:    ALPRAZolam (XANAX) 0.25 MG tablet, Take 1 tablet (0.25 mg total) by mouth 2 (two) times daily as needed for anxiety., Disp: 60 tablet, Rfl: 1   apixaban (ELIQUIS) 2.5 MG TABS tablet, Take 1 tablet (2.5 mg total) by mouth 2 (two) times daily., Disp: 180 tablet, Rfl: 1   atorvastatin (LIPITOR) 20 MG tablet, Take 1 tablet (20 mg total) by mouth daily., Disp: 90 tablet, Rfl: 1   citalopram (CELEXA) 20 MG tablet, Take 1 tablet (20 mg total) by mouth daily., Disp: 90 tablet, Rfl: 1   furosemide (LASIX) 20 MG tablet, Take 1 tablet (20 mg total) by mouth daily., Disp: 90 tablet, Rfl: 1   Magnesium Oxide 400 (240 Mg) MG TABS, TK 1 T PO D, Disp: 30  tablet, Rfl: 3   metoprolol tartrate (LOPRESSOR) 12.5 mg TABS tablet, Take 12.5 mg by mouth 2 (two) times daily., Disp: , Rfl:    pantoprazole (PROTONIX) 40 MG tablet, Take 1 tablet (40 mg total) by mouth daily., Disp: 90 tablet, Rfl: 1  Allergies  Allergen Reactions   Iodine Shortness Of Breath and Rash   Past Medical History:  Diagnosis Date   Hyperlipidemia    Hypertension     Observations/Objective: A&O  No respiratory distress or wheezing audible over the phone Mood, judgement, and thought processes all WNL  Assessment and Plan: 1. Numbness of both lower extremities - Discussed that we could do lab work and that peripheral neuropathy is a potential side effect of amiodarone with long term use. Patient does not wish to do anything today as long as it isn't life threatening. She has an appointment with her cardiologist next Monday and states she will mention it to him and see if he can do some lab work.    Follow Up Instructions:  I discussed the assessment and treatment plan with the patient. The patient was provided an opportunity to ask questions and all were answered. The patient agreed with the plan and demonstrated an understanding of the instructions.   The patient was advised to call back or seek an in-person evaluation if the symptoms worsen or if the condition fails to improve as anticipated.  The above assessment and management plan was discussed  with the patient. The patient verbalized understanding of and has agreed to the management plan. Patient is aware to call the clinic if symptoms persist or worsen. Patient is aware when to return to the clinic for a follow-up visit. Patient educated on when it is appropriate to go to the emergency department.   Time call ended: 9:40 AM  I provided 14 minutes of non-face-to-face time during this encounter.  Deliah Boston, MSN, APRN, FNP-C [facility] 05/16/19

## 2019-05-22 DIAGNOSIS — I5022 Chronic systolic (congestive) heart failure: Secondary | ICD-10-CM | POA: Diagnosis not present

## 2019-05-22 DIAGNOSIS — E782 Mixed hyperlipidemia: Secondary | ICD-10-CM | POA: Diagnosis not present

## 2019-05-22 DIAGNOSIS — I10 Essential (primary) hypertension: Secondary | ICD-10-CM | POA: Diagnosis not present

## 2019-05-22 DIAGNOSIS — I4819 Other persistent atrial fibrillation: Secondary | ICD-10-CM | POA: Diagnosis not present

## 2019-06-19 ENCOUNTER — Other Ambulatory Visit: Payer: Self-pay | Admitting: *Deleted

## 2019-06-19 DIAGNOSIS — I10 Essential (primary) hypertension: Secondary | ICD-10-CM

## 2019-06-19 DIAGNOSIS — K219 Gastro-esophageal reflux disease without esophagitis: Secondary | ICD-10-CM

## 2019-06-19 DIAGNOSIS — E785 Hyperlipidemia, unspecified: Secondary | ICD-10-CM

## 2019-06-19 MED ORDER — PANTOPRAZOLE SODIUM 40 MG PO TBEC: 40.0000 mg | DELAYED_RELEASE_TABLET | Freq: Every day | ORAL | 1 refills | Status: DC

## 2019-06-19 MED ORDER — ATORVASTATIN CALCIUM 20 MG PO TABS: 20.0000 mg | ORAL_TABLET | Freq: Every day | ORAL | 1 refills | Status: DC

## 2019-07-06 ENCOUNTER — Other Ambulatory Visit: Payer: Self-pay

## 2019-07-10 ENCOUNTER — Ambulatory Visit (INDEPENDENT_AMBULATORY_CARE_PROVIDER_SITE_OTHER): Payer: Medicare HMO | Admitting: Nurse Practitioner

## 2019-07-10 ENCOUNTER — Encounter: Payer: Self-pay | Admitting: Nurse Practitioner

## 2019-07-10 DIAGNOSIS — K219 Gastro-esophageal reflux disease without esophagitis: Secondary | ICD-10-CM

## 2019-07-10 DIAGNOSIS — I4811 Longstanding persistent atrial fibrillation: Secondary | ICD-10-CM

## 2019-07-10 DIAGNOSIS — I10 Essential (primary) hypertension: Secondary | ICD-10-CM

## 2019-07-10 DIAGNOSIS — E669 Obesity, unspecified: Secondary | ICD-10-CM

## 2019-07-10 DIAGNOSIS — I5022 Chronic systolic (congestive) heart failure: Secondary | ICD-10-CM | POA: Diagnosis not present

## 2019-07-10 DIAGNOSIS — F419 Anxiety disorder, unspecified: Secondary | ICD-10-CM

## 2019-07-10 DIAGNOSIS — E785 Hyperlipidemia, unspecified: Secondary | ICD-10-CM

## 2019-07-10 DIAGNOSIS — R69 Illness, unspecified: Secondary | ICD-10-CM | POA: Diagnosis not present

## 2019-07-10 MED ORDER — APIXABAN 2.5 MG PO TABS: 2.5000 mg | ORAL_TABLET | Freq: Two times a day (BID) | ORAL | 1 refills | Status: DC

## 2019-07-10 MED ORDER — ALPRAZOLAM 0.25 MG PO TABS: 0.2500 mg | ORAL_TABLET | Freq: Two times a day (BID) | ORAL | 2 refills | Status: DC | PRN

## 2019-07-10 NOTE — Progress Notes (Signed)
Virtual Visit via telephone Note Due to COVID-19 pandemic this visit was conducted virtually. This visit type was conducted due to national recommendations for restrictions regarding the COVID-19 Pandemic (e.g. social distancing, sheltering in place) in an effort to limit this patient's exposure and mitigate transmission in our community. All issues noted in this document were discussed and addressed.  A physical exam was not performed with this format.  I connected with Anita Daniels on 07/10/19 at 10:30 by telephone and verified that I am speaking with the correct person using two identifiers. Anita Daniels is currently located at home and no one is currently with her during visit. The provider, Mary-Margaret Daphine Deutscher, FNP is located in their office at time of visit.  I discussed the limitations, risks, security and privacy concerns of performing an evaluation and management service by telephone and the availability of in person appointments. I also discussed with the patient that there may be a patient responsible charge related to this service. The patient expressed understanding and agreed to proceed.   History and Present Illness:   Chief Complaint: Medical Management of Chronic Issues    HPI:  1. Essential hypertension No c/o chest pain, sob or headache. Does not check blood pressure at home. BP Readings from Last 3 Encounters:  04/18/19 109/83  03/24/19 102/77  03/16/19 138/66     2. Hyperlipidemia, unspecified hyperlipidemia type Does not really watch diet and does very little exercise. Lab Results  Component Value Date   CHOL 118 03/09/2019   HDL 33 (L) 03/09/2019   LDLCALC 67 03/09/2019   TRIG 89 03/09/2019   CHOLHDL 3.6 03/09/2019     3. Chronic systolic heart failure (HCC) Had follow up appointment with cardiology on 05/22/19. According to office note, no changes were made to plan of care. She is to follow up in 3 months.  4. Longstanding persistent  atrial fibrillation (HCC) [place] score is 5. She is currently on eliquis daily without any bleeding problems.  5. Gastroesophageal reflux disease, unspecified whether esophagitis present Is on protonix daily and is doing eell  6. Anxiety Is on xanax daily and is doing well. GAD 7 : Generalized Anxiety Score 07/10/2019 04/18/2019  Nervous, Anxious, on Edge 1 1  Control/stop worrying 1 1  Worry too much - different things 1 1  Trouble relaxing 0 0  Restless 0 0  Easily annoyed or irritable 0 0  Afraid - awful might happen 0 0  Total GAD 7 Score 3 3  Anxiety Difficulty Somewhat difficult -      7. Obesity (BMI 30-39.9) No recent weight changes- weight today 243lbs Wt Readings from Last 3 Encounters:  04/18/19 254 lb (115.2 kg)  03/16/19 160 lb (72.6 kg)  07/06/18 277 lb 6.4 oz (125.8 kg)   BMI Readings from Last 3 Encounters:  04/18/19 35.43 kg/m  03/16/19 22.32 kg/m  07/06/18 38.69 kg/m       Outpatient Encounter Medications as of 07/10/2019  Medication Sig  . ALPRAZolam (XANAX) 0.25 MG tablet Take 1 tablet (0.25 mg total) by mouth 2 (two) times daily as needed for anxiety.  Marland Kitchen apixaban (ELIQUIS) 2.5 MG TABS tablet Take 1 tablet (2.5 mg total) by mouth 2 (two) times daily.  Marland Kitchen atorvastatin (LIPITOR) 20 MG tablet Take 1 tablet (20 mg total) by mouth daily.  . citalopram (CELEXA) 20 MG tablet Take 1 tablet (20 mg total) by mouth daily.  . furosemide (LASIX) 20 MG tablet Take 1 tablet (20 mg  total) by mouth daily.  . Magnesium Oxide 400 (240 Mg) MG TABS TK 1 T PO D  . metoprolol tartrate (LOPRESSOR) 12.5 mg TABS tablet Take 12.5 mg by mouth 2 (two) times daily.  . pantoprazole (PROTONIX) 40 MG tablet Take 1 tablet (40 mg total) by mouth daily.     Past Surgical History:  Procedure Laterality Date  . PARTIAL KNEE ARTHROPLASTY      History reviewed. No pertinent family history.  New complaints: None today  Social history: Lives alone- has family checks on her daily   Controlled substance contract: 04/19/19    Review of Systems  Constitutional: Negative for diaphoresis and weight loss.  Eyes: Negative for blurred vision, double vision and pain.  Respiratory: Negative for shortness of breath.   Cardiovascular: Negative for chest pain, palpitations, orthopnea and leg swelling.  Gastrointestinal: Negative for abdominal pain.  Skin: Negative for rash.  Neurological: Negative for dizziness, sensory change, loss of consciousness, weakness and headaches.  Endo/Heme/Allergies: Negative for polydipsia. Does not bruise/bleed easily.  Psychiatric/Behavioral: Negative for memory loss. The patient does not have insomnia.   All other systems reviewed and are negative.    Observations/Objective: Alert and oriented- answers all questions appropriately No distress    Assessment and Plan: Anita Daniels comes in today with chief complaint of Medical Management of Chronic Issues   Diagnosis and orders addressed:  1. Essential hypertension Low sodium diet  2. Hyperlipidemia, unspecified hyperlipidemia type Low fat diet  3. Chronic systolic heart failure ([place]) Keep follow up with cardiology  4. Longstanding persistent atrial fibrillation (HCC)  - apixaban (ELIQUIS) 2.5 MG TABS tablet; Take 1 tablet (2.5 mg total) by mouth 2 (two) times daily.  Dispense: 180 tablet; Refill: 1  5. Gastroesophageal reflux disease, unspecified whether esophagitis present Avoid spicy foods Do not eat 2 hours prior to bedtime  6. Anxiety Stress management - ALPRAZolam (XANAX) 0.25 MG tablet; Take 1 tablet (0.25 mg total) by mouth 2 (two) times daily as needed for anxiety.  Dispense: 60 tablet; Refill: 2  7. Obesity (BMI 30-39.9) Discussed diet and exercise for person with BMI >25 Will recheck weight in 3-6 months   Labs pending Health Maintenance reviewed Diet and exercise encouraged  Follow up plan: 3 months      I discussed the assessment and  treatment plan with the patient. The patient was provided an opportunity to ask questions and all were answered. The patient agreed with the plan and demonstrated an understanding of the instructions.   The patient was advised to call back or seek an in-person evaluation if the symptoms worsen or if the condition fails to improve as anticipated.  The above assessment and management plan was discussed with the patient. The patient verbalized understanding of and has agreed to the management plan. Patient is aware to call the clinic if symptoms persist or worsen. Patient is aware when to return to the clinic for a follow-up visit. Patient educated on when it is appropriate to go to the emergency department.   Time call ended:  10:45  I provided 15 minutes of non-face-to-face time during this encounter.    Mary-Margaret Hassell Done, FNP

## 2019-07-18 ENCOUNTER — Ambulatory Visit: Payer: Medicare HMO | Admitting: Nurse Practitioner

## 2019-07-19 ENCOUNTER — Other Ambulatory Visit: Payer: Self-pay | Admitting: Nurse Practitioner

## 2019-07-19 MED ORDER — MAGNESIUM OXIDE -MG SUPPLEMENT 400 (240 MG) MG PO TABS: ORAL_TABLET | ORAL | 0 refills | Status: DC

## 2019-07-19 NOTE — Telephone Encounter (Signed)
Pt aware refill sent to pharmacy 

## 2019-08-08 ENCOUNTER — Other Ambulatory Visit: Payer: Self-pay | Admitting: Nurse Practitioner

## 2019-08-08 DIAGNOSIS — F419 Anxiety disorder, unspecified: Secondary | ICD-10-CM

## 2019-08-16 ENCOUNTER — Other Ambulatory Visit: Payer: Self-pay | Admitting: Nurse Practitioner

## 2019-08-16 ENCOUNTER — Telehealth: Payer: Self-pay | Admitting: Nurse Practitioner

## 2019-08-16 NOTE — Telephone Encounter (Signed)
Patient aware she should have refills at pharmacy to fill on 08/10/19 and 09/10/19

## 2019-08-18 ENCOUNTER — Other Ambulatory Visit: Payer: Self-pay

## 2019-08-18 DIAGNOSIS — F419 Anxiety disorder, unspecified: Secondary | ICD-10-CM

## 2019-08-18 MED ORDER — CITALOPRAM HYDROBROMIDE 20 MG PO TABS: 20.0000 mg | ORAL_TABLET | Freq: Every day | ORAL | 1 refills | Status: DC

## 2019-08-20 ENCOUNTER — Other Ambulatory Visit: Payer: Self-pay | Admitting: Nurse Practitioner

## 2019-08-20 DIAGNOSIS — F419 Anxiety disorder, unspecified: Secondary | ICD-10-CM

## 2019-08-23 ENCOUNTER — Other Ambulatory Visit: Payer: Self-pay | Admitting: *Deleted

## 2019-08-23 DIAGNOSIS — F419 Anxiety disorder, unspecified: Secondary | ICD-10-CM

## 2019-08-23 MED ORDER — CITALOPRAM HYDROBROMIDE 20 MG PO TABS: 20.0000 mg | ORAL_TABLET | Freq: Every day | ORAL | 1 refills | Status: DC

## 2019-08-23 NOTE — Telephone Encounter (Signed)
Fax from [facility], system error did not save original script for citalopram, resent from 08/18/19 refill

## 2019-08-28 DIAGNOSIS — I10 Essential (primary) hypertension: Secondary | ICD-10-CM | POA: Diagnosis not present

## 2019-08-28 DIAGNOSIS — Z7689 Persons encountering health services in other specified circumstances: Secondary | ICD-10-CM | POA: Diagnosis not present

## 2019-08-28 DIAGNOSIS — I5022 Chronic systolic (congestive) heart failure: Secondary | ICD-10-CM | POA: Diagnosis not present

## 2019-08-28 DIAGNOSIS — E782 Mixed hyperlipidemia: Secondary | ICD-10-CM | POA: Diagnosis not present

## 2019-08-28 DIAGNOSIS — I4819 Other persistent atrial fibrillation: Secondary | ICD-10-CM | POA: Diagnosis not present

## 2019-09-19 ENCOUNTER — Other Ambulatory Visit: Payer: Self-pay

## 2019-09-19 ENCOUNTER — Other Ambulatory Visit: Payer: Self-pay | Admitting: Nurse Practitioner

## 2019-09-19 DIAGNOSIS — K219 Gastro-esophageal reflux disease without esophagitis: Secondary | ICD-10-CM

## 2019-09-19 MED ORDER — MAGNESIUM OXIDE 400 MG PO TABS: ORAL_TABLET | ORAL | 0 refills | Status: AC

## 2019-09-19 MED ORDER — PANTOPRAZOLE SODIUM 40 MG PO TBEC: 40.0000 mg | DELAYED_RELEASE_TABLET | Freq: Every day | ORAL | 1 refills | Status: DC

## 2019-09-19 NOTE — Telephone Encounter (Signed)
What is the name of the medication? Atorvastatin-20 mg for 90 days supply & Pantoprazole?  Have you contacted your pharmacy to request a refill? Yes  Which pharmacy would you like this sent to? [facility]  Patient notified that their request is being sent to the clinical staff for review and that they should receive a call once it is complete. If they do not receive a call within 24 hours they can check with their pharmacy or our office.   MMM's pt.

## 2019-10-12 ENCOUNTER — Ambulatory Visit: Payer: Medicare HMO | Admitting: Nurse Practitioner

## 2019-10-13 ENCOUNTER — Other Ambulatory Visit: Payer: Self-pay

## 2019-10-13 DIAGNOSIS — I351 Nonrheumatic aortic (valve) insufficiency: Secondary | ICD-10-CM | POA: Diagnosis not present

## 2019-10-13 DIAGNOSIS — I340 Nonrheumatic mitral (valve) insufficiency: Secondary | ICD-10-CM | POA: Diagnosis not present

## 2019-10-13 DIAGNOSIS — I5022 Chronic systolic (congestive) heart failure: Secondary | ICD-10-CM | POA: Diagnosis not present

## 2019-10-16 ENCOUNTER — Encounter: Payer: Self-pay | Admitting: Nurse Practitioner

## 2019-10-16 ENCOUNTER — Other Ambulatory Visit: Payer: Self-pay

## 2019-10-16 ENCOUNTER — Ambulatory Visit (INDEPENDENT_AMBULATORY_CARE_PROVIDER_SITE_OTHER): Payer: Medicare HMO | Admitting: Nurse Practitioner

## 2019-10-16 VITALS — BP 136/82 | HR 56 | Temp 97.8°F | Ht 71.0 in | Wt 252.8 lb

## 2019-10-16 DIAGNOSIS — K219 Gastro-esophageal reflux disease without esophagitis: Secondary | ICD-10-CM

## 2019-10-16 DIAGNOSIS — R609 Edema, unspecified: Secondary | ICD-10-CM

## 2019-10-16 DIAGNOSIS — I5022 Chronic systolic (congestive) heart failure: Secondary | ICD-10-CM | POA: Diagnosis not present

## 2019-10-16 DIAGNOSIS — E669 Obesity, unspecified: Secondary | ICD-10-CM

## 2019-10-16 DIAGNOSIS — F419 Anxiety disorder, unspecified: Secondary | ICD-10-CM

## 2019-10-16 DIAGNOSIS — Z23 Encounter for immunization: Secondary | ICD-10-CM

## 2019-10-16 DIAGNOSIS — E785 Hyperlipidemia, unspecified: Secondary | ICD-10-CM

## 2019-10-16 DIAGNOSIS — I10 Essential (primary) hypertension: Secondary | ICD-10-CM

## 2019-10-16 DIAGNOSIS — I4811 Longstanding persistent atrial fibrillation: Secondary | ICD-10-CM | POA: Diagnosis not present

## 2019-10-16 DIAGNOSIS — R69 Illness, unspecified: Secondary | ICD-10-CM | POA: Diagnosis not present

## 2019-10-16 MED ORDER — CITALOPRAM HYDROBROMIDE 20 MG PO TABS: 20.0000 mg | ORAL_TABLET | Freq: Every day | ORAL | 1 refills | Status: AC

## 2019-10-16 MED ORDER — FUROSEMIDE 20 MG PO TABS: 20.0000 mg | ORAL_TABLET | Freq: Every day | ORAL | 1 refills | Status: AC

## 2019-10-16 MED ORDER — LISINOPRIL 20 MG PO TABS: 20.0000 mg | ORAL_TABLET | Freq: Every day | ORAL | 1 refills | Status: AC

## 2019-10-16 MED ORDER — PANTOPRAZOLE SODIUM 40 MG PO TBEC: 40.0000 mg | DELAYED_RELEASE_TABLET | Freq: Every day | ORAL | 1 refills | Status: AC

## 2019-10-16 MED ORDER — APIXABAN 2.5 MG PO TABS: 2.5000 mg | ORAL_TABLET | Freq: Two times a day (BID) | ORAL | 1 refills | Status: DC

## 2019-10-16 MED ORDER — ATORVASTATIN CALCIUM 20 MG PO TABS: 20.0000 mg | ORAL_TABLET | Freq: Every day | ORAL | 1 refills | Status: AC

## 2019-10-16 NOTE — Patient Instructions (Signed)

## 2019-10-16 NOTE — Addendum Note (Signed)
Addended by: Austin Miles F on: 10/16/2019 11:46 AM   Modules accepted: Orders

## 2019-10-16 NOTE — Progress Notes (Signed)
Subjective:    Patient ID: Anita Daniels, female    DOB: [birth year], 84 y.o.   MRN: [id number]   Chief Complaint: Medical Management of Chronic Issues    HPI:  1. Essential hypertension No c/o chest pain, sob or headache. Does not check blood pressure at home. BP Readings from Last 3 Encounters:  10/16/19 (!) 145/75  04/18/19 109/83  03/24/19 102/77     2. Hyperlipidemia, unspecified hyperlipidemia type Does not watch diet and does little to no exercise. Lab Results  Component Value Date   CHOL 118 03/09/2019   HDL 33 (L) 03/09/2019   LDLCALC 67 03/09/2019   TRIG 89 03/09/2019   CHOLHDL 3.6 03/09/2019     3. Chronic systolic heart failure ([place]) Patient had echo cardiogram  On 10/13/19 was stake from last check with mild left ventircular dilitation.  4. Gastroesophageal reflux disease, unspecified whether esophagitis present Is on protonix daily and is doing well.  5. Longstanding persistent atrial fibrillation ([place]) She is on eliquis and is doing well. Denies any palpitations  6. Anxiety Takes celexa daily and is doing well. Very seldom takes her xanax. Depression screen [facility] 2/9 10/16/2019 04/18/2019 03/24/2019  Decreased Interest 0 0 0  Down, Depressed, Hopeless 0 0 0  PHQ - 2 Score 0 0 0  Altered sleeping - - -  Tired, decreased energy - - -  Change in appetite - - -  Feeling bad or failure about yourself  - - -  Trouble concentrating - - -  Moving slowly or fidgety/restless - - -  Suicidal thoughts - - -  PHQ-9 Score - - -  Difficult doing work/chores - - -   GAD 7 : Generalized Anxiety Score 10/16/2019 07/10/2019 04/18/2019  Nervous, Anxious, on Edge _0 Control/stop worrying _1 Worry too much - different things _2 Trouble relaxing 0 0 0  Restless 0 0 0  Easily annoyed or irritable 0 0 0  Afraid - awful might happen 0 0 0  Total GAD 7 Score _3 Anxiety Difficulty - Somewhat difficult -       7. Obesity (BMI 30-39.9) No recent  weight changes Wt Readings from Last 3 Encounters:  10/16/19 252 lb 12.8 oz (114.7 kg)  04/18/19 254 lb (115.2 kg)  03/16/19 160 lb (72.6 kg)   BMI Readings from Last 3 Encounters:  10/16/19 35.26 kg/m  04/18/19 35.43 kg/m  03/16/19 22.32 kg/m       Outpatient Encounter Medications as of 10/16/2019  Medication Sig  . ALPRAZolam (XANAX) 0.25 MG tablet Take 1 tablet (0.25 mg total) by mouth 2 (two) times daily as needed for anxiety.  Marland Kitchen amiodarone (PACERONE) 400 MG tablet   . apixaban (ELIQUIS) 2.5 MG TABS tablet Take 1 tablet (2.5 mg total) by mouth 2 (two) times daily.  Marland Kitchen atorvastatin (LIPITOR) 20 MG tablet Take 1 tablet (20 mg total) by mouth daily.  . citalopram (CELEXA) 20 MG tablet Take 1 tablet (20 mg total) by mouth daily.  . furosemide (LASIX) 20 MG tablet Take 1 tablet (20 mg total) by mouth daily.  Marland Kitchen lisinopril (ZESTRIL) 20 MG tablet Take 20 mg by mouth daily.  . magnesium oxide (MAG-OX) 400 MG tablet TAKE 1 TABLET BY MOUTH EVERY DAY  . metoprolol tartrate (LOPRESSOR) 25 MG tablet Take by mouth.  . pantoprazole (PROTONIX) 40 MG tablet Take 1 tablet (40 mg total) by mouth daily.  Past Surgical History:  Procedure Laterality Date  . PARTIAL KNEE ARTHROPLASTY      History reviewed. No pertinent family history.  New complaints: None today  Social history: Lives alone and family checks on her daioly.  Controlled substance contract: n/a    Review of Systems  Constitutional: Negative for diaphoresis.  Eyes: Negative for pain.  Respiratory: Negative for shortness of breath.   Cardiovascular: Negative for chest pain, palpitations and leg swelling.  Gastrointestinal: Negative for abdominal pain.  Endocrine: Negative for polydipsia.  Skin: Negative for rash.  Neurological: Negative for dizziness, weakness and headaches.  Hematological: Does not bruise/bleed easily.  All other systems reviewed and are negative.      Objective:   Physical Exam Vitals and  nursing note reviewed.  Constitutional:      General: She is not in acute distress.    Appearance: Normal appearance. She is well-developed.  HENT:     Head: Normocephalic.     Nose: Nose normal.  Eyes:     Pupils: Pupils are equal, round, and reactive to light.  Neck:     Vascular: No carotid bruit or JVD.  Cardiovascular:     Rate and Rhythm: Normal rate and regular rhythm.     Heart sounds: Normal heart sounds.  Pulmonary:     Effort: Pulmonary effort is normal. No respiratory distress.     Breath sounds: Normal breath sounds. No wheezing or rales.  Chest:     Chest wall: No tenderness.  Abdominal:     General: Bowel sounds are normal. There is no distension or abdominal bruit.     Palpations: Abdomen is soft. There is no hepatomegaly, splenomegaly, mass or pulsatile mass.     Tenderness: There is no abdominal tenderness.  Musculoskeletal:        General: Normal range of motion.     Cervical back: Normal range of motion and neck supple.     Right lower leg: No edema.     Left lower leg: No edema.  Lymphadenopathy:     Cervical: No cervical adenopathy.  Skin:    General: Skin is warm and dry.  Neurological:     Mental Status: She is alert and oriented to person, place, and time.     Deep Tendon Reflexes: Reflexes are normal and symmetric.  Psychiatric:        Behavior: Behavior normal.        Thought Content: Thought content normal.        Judgment: Judgment normal.     BP (!) 145/75   Pulse (!) 56   Temp 97.8 F (36.6 C) (Temporal)   Ht _0  (1.803 m)   Wt 252 lb 12.8 oz (114.7 kg)   SpO2 97%   BMI 35.26 kg/m        Assessment & Plan:  Anita Daniels comes in today with chief complaint of Medical Management of Chronic Issues   Diagnosis and orders addressed:  1. Essential hypertension Low sodium diet - lisinopril (ZESTRIL) 20 MG tablet; Take 1 tablet (20 mg total) by mouth daily.  Dispense: 90 tablet; Refill: 1 - CBC with Differential/Platelet  - CMP14+EGFR  2. Hyperlipidemia, unspecified hyperlipidemia type Low fat diet - atorvastatin (LIPITOR) 20 MG tablet; Take 1 tablet (20 mg total) by mouth daily.  Dispense: 90 tablet; Refill: 1 - Lipid panel  3. Chronic systolic heart failure ([place]) Keep follow up with cardiology  4. Gastroesophageal reflux disease, unspecified whether esophagitis present Avoid spicy  foods Do not eat 2 hours prior to bedtime - pantoprazole (PROTONIX) 40 MG tablet; Take 1 tablet (40 mg total) by mouth daily.  Dispense: 90 tablet; Refill: 1  5. Longstanding persistent atrial fibrillation (HCC) - apixaban (ELIQUIS) 2.5 MG TABS tablet; Take 1 tablet (2.5 mg total) by mouth 2 (two) times daily.  Dispense: 180 tablet; Refill: 1  6. Anxiety Stress management - citalopram (CELEXA) 20 MG tablet; Take 1 tablet (20 mg total) by mouth daily.  Dispense: 90 tablet; Refill: 1  7. Obesity (BMI 30-39.9) Discussed diet and exercise for person with BMI >25 Will recheck weight in 3-6 months   8. Peripheral edema elevat elegs when sitting - furosemide (LASIX) 20 MG tablet; Take 1 tablet (20 mg total) by mouth daily.  Dispense: 90 tablet; Refill: 1   Labs pending Health Maintenance reviewed Diet and exercise encouraged  Follow up plan: 6 months   Mary-Margaret Hassell Done, FNP

## 2019-10-17 LAB — CBC WITH DIFFERENTIAL/PLATELET
Basophils Absolute: 0.1 10*3/uL (ref 0.0–0.2)
Basos: 1 %
EOS (ABSOLUTE): 0.1 10*3/uL (ref 0.0–0.4)
Eos: 1 %
Hematocrit: 43.6 % (ref 34.0–46.6)
Hemoglobin: 14.6 g/dL (ref 11.1–15.9)
Immature Grans (Abs): 0 10*3/uL (ref 0.0–0.1)
Immature Granulocytes: 0 %
Lymphocytes Absolute: 3.2 10*3/uL — ABNORMAL HIGH (ref 0.7–3.1)
Lymphs: 31 %
MCH: 32.2 pg (ref 26.6–33.0)
MCHC: 33.5 g/dL (ref 31.5–35.7)
MCV: 96 fL (ref 79–97)
Monocytes Absolute: 1 10*3/uL — ABNORMAL HIGH (ref 0.1–0.9)
Monocytes: 10 %
Neutrophils Absolute: 5.9 10*3/uL (ref 1.4–7.0)
Neutrophils: 57 %
Platelets: 178 10*3/uL (ref 150–450)
RBC: 4.53 x10E6/uL (ref 3.77–5.28)
RDW: 12.3 % (ref 11.7–15.4)
WBC: 10.3 10*3/uL (ref 3.4–10.8)

## 2019-10-17 LAB — CMP14+EGFR
ALT: 19 IU/L (ref 0–32)
AST: 26 IU/L (ref 0–40)
Albumin/Globulin Ratio: 1.7 (ref 1.2–2.2)
Albumin: 4.2 g/dL (ref 3.6–4.6)
Alkaline Phosphatase: 134 IU/L — ABNORMAL HIGH (ref 39–117)
BUN/Creatinine Ratio: 22 (ref 12–28)
BUN: 18 mg/dL (ref 8–27)
Bilirubin Total: 0.8 mg/dL (ref 0.0–1.2)
CO2: 27 mmol/L (ref 20–29)
Calcium: 8.8 mg/dL (ref 8.7–10.3)
Chloride: 101 mmol/L (ref 96–106)
Creatinine, Ser: 0.83 mg/dL (ref 0.57–1.00)
GFR calc Af Amer: 76 mL/min/{1.73_m2} (ref >59)
GFR calc non Af Amer: 66 mL/min/{1.73_m2} (ref >59)
Globulin, Total: 2.5 g/dL (ref 1.5–4.5)
Glucose: 91 mg/dL (ref 65–99)
Potassium: 4.1 mmol/L (ref 3.5–5.2)
Sodium: 143 mmol/L (ref 134–144)
Total Protein: 6.7 g/dL (ref 6.0–8.5)

## 2019-10-17 LAB — LIPID PANEL
Chol/HDL Ratio: 2.3 ratio (ref 0.0–4.4)
Cholesterol, Total: 154 mg/dL (ref 100–199)
HDL: 66 mg/dL (ref >39)
LDL Chol Calc (NIH): 74 mg/dL (ref 0–99)
Triglycerides: 71 mg/dL (ref 0–149)
VLDL Cholesterol Cal: 14 mg/dL (ref 5–40)

## 2019-11-13 ENCOUNTER — Telehealth: Payer: Self-pay | Admitting: Nurse Practitioner

## 2019-11-13 NOTE — Telephone Encounter (Signed)
Needs apt

## 2019-11-14 ENCOUNTER — Ambulatory Visit (INDEPENDENT_AMBULATORY_CARE_PROVIDER_SITE_OTHER): Payer: Medicare HMO | Admitting: Family Medicine

## 2019-11-14 ENCOUNTER — Other Ambulatory Visit: Payer: Self-pay

## 2019-11-14 ENCOUNTER — Encounter: Payer: Self-pay | Admitting: Family Medicine

## 2019-11-14 VITALS — BP 154/83 | HR 66 | Temp 98.0°F | Ht 71.0 in | Wt 252.0 lb

## 2019-11-14 DIAGNOSIS — G5602 Carpal tunnel syndrome, left upper limb: Secondary | ICD-10-CM

## 2019-11-14 MED ORDER — PREDNISONE 20 MG PO TABS: ORAL_TABLET | ORAL | 0 refills | Status: DC

## 2019-11-14 NOTE — Patient Instructions (Signed)
Carpal Tunnel Syndrome  Carpal tunnel syndrome is a condition that causes pain in your hand and arm. The carpal tunnel is a narrow area that is on the palm side of your wrist. Repeated wrist motion or certain diseases may cause swelling in the tunnel. This swelling can pinch the main nerve in the wrist (median nerve). What are the causes? This condition may be caused by:  Repeated wrist motions.  Wrist injuries.  Arthritis.  A sac of fluid (cyst) or abnormal growth (tumor) in the carpal tunnel.  Fluid buildup during pregnancy. Sometimes the cause is not known. What increases the risk? The following factors may make you more likely to develop this condition:  Having a job in which you move your wrist in the same way many times. This includes jobs like being a butcher or a cashier.  Being a woman.  Having other health conditions, such as: ? Diabetes. ? Obesity. ? A thyroid gland that is not active enough (hypothyroidism). ? Kidney failure. What are the signs or symptoms? Symptoms of this condition include:  A tingling feeling in your fingers.  Tingling or a loss of feeling (numbness) in your hand.  Pain in your entire arm. This pain may get worse when you bend your wrist and elbow for a long time.  Pain in your wrist that goes up your arm to your shoulder.  Pain that goes down into your palm or fingers.  A weak feeling in your hands. You may find it hard to grab and hold items. You may feel worse at night. How is this diagnosed? This condition is diagnosed with a medical history and physical exam. You may also have tests, such as:  Electromyogram (EMG). This test checks the signals that the nerves send to the muscles.  Nerve conduction study. This test checks how well signals pass through your nerves.  Imaging tests, such as X-rays, ultrasound, and MRI. These tests check for what might be the cause of your condition. How is this treated? This condition may be treated  with:  Lifestyle changes. You will be asked to stop or change the activity that caused your problem.  Doing exercise and activities that make bones and muscles stronger (physical therapy).  Learning how to use your hand again (occupational therapy).  Medicines for pain and swelling (inflammation). You may have injections in your wrist.  A wrist splint.  Surgery. Follow these instructions at home: If you have a splint:  Wear the splint as told by your doctor. Remove it only as told by your doctor.  Loosen the splint if your fingers: ? Tingle. ? Lose feeling (become numb). ? Turn cold and blue.  Keep the splint clean.  If the splint is not waterproof: ? Do not let it get wet. ? Cover it with a watertight covering when you take a bath or a shower. Managing pain, stiffness, and swelling   If told, put ice on the painful area: ? If you have a removable splint, remove it as told by your doctor. ? Put ice in a plastic bag. ? Place a towel between your skin and the bag. ? Leave the ice on for 20 minutes, 2-3 times per day. General instructions  Take over-the-counter and prescription medicines only as told by your doctor.  Rest your wrist from any activity that may cause pain. If needed, talk with your boss at work about changes that can help your wrist heal.  Do any exercises as told by your doctor,   physical therapist, or occupational therapist.  Keep all follow-up visits as told by your doctor. This is important. Contact a doctor if:  You have new symptoms.  Medicine does not help your pain.  Your symptoms get worse. Get help right away if:  You have very bad numbness or tingling in your wrist or hand. Summary  Carpal tunnel syndrome is a condition that causes pain in your hand and arm.  It is often caused by repeated wrist motions.  Lifestyle changes and medicines are used to treat this problem. Surgery may help in very bad cases.  Follow your doctor's  instructions about wearing a splint, resting your wrist, keeping follow-up visits, and calling for help. This information is not intended to replace advice given to you by your health care provider. Make sure you discuss any questions you have with your health care provider. Document Revised: 11/26/2017 Document Reviewed: 11/26/2017 Elsevier Patient Education  2020 [employer]  

## 2019-11-16 ENCOUNTER — Ambulatory Visit: Payer: Medicare HMO

## 2019-11-16 NOTE — Progress Notes (Signed)
Subjective:  Patient ID: Anita Daniels, female    DOB: 1935/08/22, 84 y.o.   MRN: [id number]  Patient Care Team: Chevis Pretty, FNP as PCP - General (Family Medicine)   Chief Complaint:  Numbness (Started in left 2nd finger. Now in all finger tips. Happened about 2 weeks ago)   HPI: Anita Daniels is a 84 y.o. female presenting on 11/14/2019 for Numbness (Started in left 2nd finger. Now in all finger tips. Happened about 2 weeks ago)   Pt presents today with left wrist and left middle finger tingling. States this started a few days ago and is not getting better. No specific injury. Does use a walker to ambulate.   Wrist Pain  The pain is present in the left wrist and left hand. This is a new problem. The current episode started 1 to 4 weeks ago. The problem occurs intermittently. The problem has been waxing and waning. The quality of the pain is described as aching and burning. The pain is at a severity of 3/10. The pain is mild. Associated symptoms include numbness and tingling. Pertinent negatives include no fever, inability to bear weight, itching, joint locking, joint swelling, limited range of motion or stiffness. The symptoms are aggravated by activity. She has tried nothing for the symptoms.      Relevant past medical, surgical, family, and social history reviewed and updated as indicated.  Allergies and medications reviewed and updated. Date reviewed: Chart in Epic.   Past Medical History:  Diagnosis Date  . Hyperlipidemia   . Hypertension     Past Surgical History:  Procedure Laterality Date  . PARTIAL KNEE ARTHROPLASTY      Social History   Socioeconomic History  . Marital status: Widowed    Spouse name: Not on file  . Number of children: Not on file  . Years of education: Not on file  . Highest education level: Not on file  Occupational History  . Not on file  Tobacco Use  . Smoking status: Never Smoker  . Smokeless tobacco: Never Used    Substance and Sexual Activity  . Alcohol use: No  . Drug use: No  . Sexual activity: Never  Other Topics Concern  . Not on file  Social History Narrative  . Not on file   Social Determinants of Health   Financial Resource Strain:   . Difficulty of Paying Living Expenses:   Food Insecurity:   . Worried About Charity fundraiser in the Last Year:   . Arboriculturist in the Last Year:   Transportation Needs:   . Film/video editor (Medical):   Marland Kitchen Lack of Transportation (Non-Medical):   Physical Activity:   . Days of Exercise per Week:   . Minutes of Exercise per Session:   Stress:   . Feeling of Stress :   Social Connections:   . Frequency of Communication with Friends and Family:   . Frequency of Social Gatherings with Friends and Family:   . Attends Religious Services:   . Active Member of Clubs or Organizations:   . Attends Archivist Meetings:   Marland Kitchen Marital Status:   Intimate Partner Violence:   . Fear of Current or Ex-Partner:   . Emotionally Abused:   Marland Kitchen Physically Abused:   . Sexually Abused:     Outpatient Encounter Medications as of 11/14/2019  Medication Sig  . ALPRAZolam (XANAX) 0.25 MG tablet Take 1 tablet (0.25 mg total) by mouth  2 (two) times daily as needed for anxiety.  Marland Kitchen amiodarone (PACERONE) 400 MG tablet   . apixaban (ELIQUIS) 2.5 MG TABS tablet Take 1 tablet (2.5 mg total) by mouth 2 (two) times daily.  Marland Kitchen atorvastatin (LIPITOR) 20 MG tablet Take 1 tablet (20 mg total) by mouth daily.  . citalopram (CELEXA) 20 MG tablet Take 1 tablet (20 mg total) by mouth daily.  . furosemide (LASIX) 20 MG tablet Take 1 tablet (20 mg total) by mouth daily.  Marland Kitchen lisinopril (ZESTRIL) 20 MG tablet Take 1 tablet (20 mg total) by mouth daily.  . magnesium oxide (MAG-OX) 400 MG tablet TAKE 1 TABLET BY MOUTH EVERY DAY  . metoprolol tartrate (LOPRESSOR) 25 MG tablet Take by mouth.  . pantoprazole (PROTONIX) 40 MG tablet Take 1 tablet (40 mg total) by mouth daily.  .  predniSONE (DELTASONE) 20 MG tablet 2 po at sametime daily for 5 days   No facility-administered encounter medications on file as of 11/14/2019.    Allergies  Allergen Reactions  . Iodine Shortness Of Breath and Rash    Review of Systems  Constitutional: Negative for activity change, appetite change, chills, diaphoresis, fatigue, fever and unexpected weight change.  Respiratory: Negative for shortness of breath.   Cardiovascular: Negative for chest pain and palpitations.  Musculoskeletal: Positive for arthralgias, gait problem (uses walker) and myalgias. Negative for back pain, joint swelling, neck pain, neck stiffness and stiffness.  Skin: Negative.  Negative for itching.  Neurological: Positive for tingling and numbness. Negative for weakness.  All other systems reviewed and are negative.       Objective:  BP (!) 154/83   Pulse 66   Temp 98 F (36.7 C)   Ht _0  (1.803 m)   Wt 252 lb (114.3 kg)   SpO2 94%   BMI 35.15 kg/m    Wt Readings from Last 3 Encounters:  11/14/19 252 lb (114.3 kg)  10/16/19 252 lb 12.8 oz (114.7 kg)  04/18/19 254 lb (115.2 kg)    Physical Exam Vitals and nursing note reviewed.  Constitutional:      General: She is not in acute distress.    Appearance: Normal appearance. She is well-developed and well-groomed. She is obese. She is not ill-appearing, toxic-appearing or diaphoretic.  HENT:     Head: Normocephalic and atraumatic.     Jaw: There is normal jaw occlusion.     Right Ear: Hearing normal.     Left Ear: Hearing normal.     Nose: Nose normal.     Mouth/Throat:     Lips: Pink.     Mouth: Mucous membranes are moist.     Pharynx: Oropharynx is clear. Uvula midline.  Eyes:     General: Lids are normal.     Extraocular Movements: Extraocular movements intact.     Conjunctiva/sclera: Conjunctivae normal.     Pupils: Pupils are equal, round, and reactive to light.  Neck:     Thyroid: No thyroid mass, thyromegaly or thyroid  tenderness.     Vascular: No carotid bruit or JVD.     Trachea: Trachea and phonation normal.  Cardiovascular:     Rate and Rhythm: Normal rate. Rhythm irregular.     Chest Wall: PMI is not displaced.     Pulses: Normal pulses.     Heart sounds: Normal heart sounds. No murmur. No friction rub. No gallop.   Pulmonary:     Effort: Pulmonary effort is normal. No respiratory distress.  Breath sounds: Normal breath sounds. No wheezing.  Abdominal:     General: Bowel sounds are normal. There is no distension or abdominal bruit.     Palpations: Abdomen is soft. There is no hepatomegaly or splenomegaly.     Tenderness: There is no abdominal tenderness. There is no right CVA tenderness or left CVA tenderness.     Hernia: No hernia is present.  Musculoskeletal:        General: Normal range of motion.     Left elbow: Normal.     Left forearm: Normal.     Left wrist: Tenderness present. No swelling, deformity, effusion, lacerations, bony tenderness, snuff box tenderness or crepitus. Normal range of motion. Normal pulse.     Left hand: Normal.     Cervical back: Normal range of motion and neck supple.     Right lower leg: No edema.     Left lower leg: No edema.     Comments: Positive Phalen sign, negative Tinel sign on left  Lymphadenopathy:     Cervical: No cervical adenopathy.  Skin:    General: Skin is warm and dry.     Capillary Refill: Capillary refill takes less than 2 seconds.     Coloration: Skin is not cyanotic, jaundiced or pale.     Findings: No rash.  Neurological:     General: No focal deficit present.     Mental Status: She is alert and oriented to person, place, and time.     Cranial Nerves: Cranial nerves are intact.     Sensory: Sensation is intact.     Motor: Motor function is intact.     Coordination: Coordination is intact.     Gait: Abnormal gait: uses walker.     Deep Tendon Reflexes: Reflexes are normal and symmetric.  Psychiatric:        Attention and  Perception: Attention and perception normal.        Mood and Affect: Mood and affect normal.        Speech: Speech normal.        Behavior: Behavior normal. Behavior is cooperative.        Thought Content: Thought content normal.        Cognition and Memory: Cognition and memory normal.        Judgment: Judgment normal.     Results for orders placed or performed in visit on 10/16/19  CBC with Differential/Platelet  Result Value Ref Range   WBC 10.3 3.4 - 10.8 x10E3/uL   RBC 4.53 3.77 - 5.28 x10E6/uL   Hemoglobin 14.6 11.1 - 15.9 g/dL   Hematocrit 43.6 34.0 - 46.6 %   MCV 96 79 - 97 fL   MCH 32.2 26.6 - 33.0 pg   MCHC 33.5 31.5 - 35.7 g/dL   RDW 12.3 11.7 - 15.4 %   Platelets 178 150 - 450 x10E3/uL   Neutrophils 57 Not Estab. %   Lymphs 31 Not Estab. %   Monocytes 10 Not Estab. %   Eos 1 Not Estab. %   Basos 1 Not Estab. %   Neutrophils Absolute 5.9 1.4 - 7.0 x10E3/uL   Lymphocytes Absolute 3.2 (H) 0.7 - 3.1 x10E3/uL   Monocytes Absolute 1.0 (H) 0.1 - 0.9 x10E3/uL   EOS (ABSOLUTE) 0.1 0.0 - 0.4 x10E3/uL   Basophils Absolute 0.1 0.0 - 0.2 x10E3/uL   Immature Granulocytes 0 Not Estab. %   Immature Grans (Abs) 0.0 0.0 - 0.1 x10E3/uL  CMP14+EGFR  Result Value Ref  Range   Glucose 91 65 - 99 mg/dL   BUN 18 8 - 27 mg/dL   Creatinine, Ser 0.83 0.57 - 1.00 mg/dL   GFR calc non Af Amer 66 >59 mL/min/1.73   GFR calc Af Amer 76 >59 mL/min/1.73   BUN/Creatinine Ratio 22 12 - 28   Sodium 143 134 - 144 mmol/L   Potassium 4.1 3.5 - 5.2 mmol/L   Chloride 101 96 - 106 mmol/L   CO2 27 20 - 29 mmol/L   Calcium 8.8 8.7 - 10.3 mg/dL   Total Protein 6.7 6.0 - 8.5 g/dL   Albumin 4.2 3.6 - 4.6 g/dL   Globulin, Total 2.5 1.5 - 4.5 g/dL   Albumin/Globulin Ratio 1.7 1.2 - 2.2   Bilirubin Total 0.8 0.0 - 1.2 mg/dL   Alkaline Phosphatase 134 (H) 39 - 117 IU/L   AST 26 0 - 40 IU/L   ALT 19 0 - 32 IU/L  Lipid panel  Result Value Ref Range   Cholesterol, Total 154 100 - 199 mg/dL    Triglycerides 71 0 - 149 mg/dL   HDL 66 >39 mg/dL   VLDL Cholesterol Cal 14 5 - 40 mg/dL   LDL Chol Calc (NIH) 74 0 - 99 mg/dL   Chol/HDL Ratio 2.3 0.0 - 4.4 ratio       Pertinent labs & imaging results that were available during my care of the patient were reviewed by me and considered in my medical decision making.  Assessment & Plan:  Chauntae was seen today for numbness.  Diagnoses and all orders for this visit:  Left carpal tunnel syndrome Physical exam and reported symptoms are consistent with left carpal tunnel syndrome. Brace applied in office. Will burst with steroids. Symptomatic care discussed in detail. Follow up in 4 weeks for reevaluation.  -     predniSONE (DELTASONE) 20 MG tablet; 2 po at sametime daily for 5 days     Continue all other maintenance medications.  Follow up plan: Return in about 4 weeks (around 12/12/2019), or if symptoms worsen or fail to improve.   Continue healthy lifestyle choices, including diet (rich in fruits, vegetables, and lean proteins, and low in salt and simple carbohydrates) and exercise (at least 30 minutes of moderate physical activity daily).  Educational handout given for carpal tunnel syndrome  The above assessment and management plan was discussed with the patient. The patient verbalized understanding of and has agreed to the management plan. Patient is aware to call the clinic if they develop any new symptoms or if symptoms persist or worsen. Patient is aware when to return to the clinic for a follow-up visit. Patient educated on when it is appropriate to go to the emergency department.   Monia Pouch, FNP-C [facility] [phone]

## 2019-11-20 ENCOUNTER — Telehealth: Payer: Self-pay | Admitting: Nurse Practitioner

## 2019-11-20 NOTE — Telephone Encounter (Signed)
Patient had an appointment with [facility] 11/14/19 for numbness in all finger tips.  States the prednisone that she was given has helped a lot.  Patient states that she only now has numbness in her left middle finger and is requesting a refill of prednisone. Please review and advise.

## 2019-11-20 NOTE — Telephone Encounter (Signed)
  Prescription Request  11/20/2019  What is the name of the medication or equipment? predniSONE (DELTASONE) 20 MG tablet    Have you contacted your pharmacy to request a refill? (if applicable) yes  Which pharmacy would you like this sent to? [facility] She was prescribed this by Marcelino Duster last week to help with Numbness in middle finger on left hand and it has helped her out a lot would like it to be refilled    Patient notified that their request is being sent to the clinical staff for review and that they should receive a response within 2 business days.

## 2019-11-20 NOTE — Telephone Encounter (Signed)
Steroids will stay in system for awhile should not need another dose.

## 2019-11-21 ENCOUNTER — Telehealth: Payer: Self-pay | Admitting: Nurse Practitioner

## 2019-11-21 MED ORDER — APIXABAN 5 MG PO TABS: 5.0000 mg | ORAL_TABLET | Freq: Two times a day (BID) | ORAL | 1 refills | Status: AC

## 2019-11-21 NOTE — Telephone Encounter (Signed)
Did switch dosages?

## 2019-11-21 NOTE — Telephone Encounter (Signed)
Patient aware and verbalized understanding. °

## 2019-11-23 ENCOUNTER — Telehealth: Payer: Self-pay | Admitting: Nurse Practitioner

## 2019-11-23 DIAGNOSIS — F419 Anxiety disorder, unspecified: Secondary | ICD-10-CM

## 2019-11-23 MED ORDER — ALPRAZOLAM 0.25 MG PO TABS: 0.2500 mg | ORAL_TABLET | Freq: Two times a day (BID) | ORAL | 2 refills | Status: AC | PRN

## 2019-11-23 NOTE — Telephone Encounter (Signed)
Pt informed. She will contact insurance company since she has already picked up the 2.5mg . Pt is worried about cost/refills/donut hole

## 2019-11-23 NOTE — Telephone Encounter (Signed)
Xanax rx sent to pharmacy

## 2019-11-23 NOTE — Telephone Encounter (Signed)
Eliquis corrected and xanax sent to [facility] [place]

## 2019-11-27 DIAGNOSIS — I10 Essential (primary) hypertension: Secondary | ICD-10-CM | POA: Diagnosis not present

## 2019-11-27 DIAGNOSIS — I5022 Chronic systolic (congestive) heart failure: Secondary | ICD-10-CM | POA: Diagnosis not present

## 2019-11-27 DIAGNOSIS — E782 Mixed hyperlipidemia: Secondary | ICD-10-CM | POA: Diagnosis not present

## 2019-11-27 DIAGNOSIS — I4819 Other persistent atrial fibrillation: Secondary | ICD-10-CM | POA: Diagnosis not present

## 2019-12-08 ENCOUNTER — Telehealth (INDEPENDENT_AMBULATORY_CARE_PROVIDER_SITE_OTHER): Payer: Medicare HMO | Admitting: Nurse Practitioner

## 2019-12-08 DIAGNOSIS — M545 Low back pain, unspecified: Secondary | ICD-10-CM

## 2019-12-08 MED ORDER — PREDNISONE 10 MG (21) PO TBPK: ORAL_TABLET | ORAL | 0 refills | Status: AC

## 2019-12-08 NOTE — Progress Notes (Signed)
   Virtual Visit via telephone Note Due to COVID-19 pandemic this visit was conducted virtually. This visit type was conducted due to national recommendations for restrictions regarding the COVID-19 Pandemic (e.g. social distancing, sheltering in place) in an effort to limit this patient's exposure and mitigate transmission in our community. All issues noted in this document were discussed and addressed.  A physical exam was not performed with this format.  I connected with Ivory Broad on 12/08/19 at 1:20 by telephone and verified that I am speaking with the correct person using two identifiers. Birgit Nowling is currently located at home  and her daughter is currently with her during visit. The provider, Mary-Margaret Daphine Deutscher, FNP is located in their office at time of visit.  I discussed the limitations, risks, security and privacy concerns of performing an evaluation and management service by telephone and the availability of in person appointments. I also discussed with the patient that there may be a patient responsible charge related to this service. The patient expressed understanding and agreed to proceed.   History and Present Illness:   Chief Complaint: Back Pain   HPI patient twited her back going out her back door . He ay it has been going on for several days. She is  having trouble walking and lifting her left foot up. Pain is on left flank area . Rates pain 9/10 currently. Heating pad help and sitting help. Standing up and waking increase pain.  Review of Systems  Constitutional: Negative for diaphoresis and weight loss.  Eyes: Negative for blurred vision, double vision and pain.  Respiratory: Negative for shortness of breath.   Cardiovascular: Negative for chest pain, palpitations, orthopnea and leg swelling.  Gastrointestinal: Negative for abdominal pain.  Musculoskeletal: Positive for back pain.  Skin: Negative for rash.  Neurological: Negative for dizziness,  sensory change, loss of consciousness, weakness and headaches.  Endo/Heme/Allergies: Negative for polydipsia. Does not bruise/bleed easily.  Psychiatric/Behavioral: Negative for memory loss. The patient does not have insomnia.   All other systems reviewed and are negative.    Observations/Objective: Alert and oriented- answers all questions appropriately moderate distress    Assessment and Plan: Ivory Broad in today with chief complaint of Back Pain   1. Acute midline low back pain without sciatica moist heat Rest Call if no improvement by Monday afternoon - predniSONE (STERAPRED UNI-PAK 21 TAB) 10 MG (21) TBPK tablet; As directed x 6 days  Dispense: 21 tablet; Refill: 0      Follow Up Instructions: prn    I discussed the assessment and treatment plan with the patient. The patient was provided an opportunity to ask questions and all were answered. The patient agreed with the plan and demonstrated an understanding of the instructions.   The patient was advised to call back or seek an in-person evaluation if the symptoms worsen or if the condition fails to improve as anticipated.  The above assessment and management plan was discussed with the patient. The patient verbalized understanding of and has agreed to the management plan. Patient is aware to call the clinic if symptoms persist or worsen. Patient is aware when to return to the clinic for a follow-up visit. Patient educated on when it is appropriate to go to the emergency department.   Time call ended:  1:35  I provided 15 minutes of non-face-to-face time during this encounter.    Mary-Margaret Daphine Deutscher, FNP

## 2019-12-09 DIAGNOSIS — X509XXA Other and unspecified overexertion or strenuous movements or postures, initial encounter: Secondary | ICD-10-CM | POA: Diagnosis not present

## 2019-12-09 DIAGNOSIS — S76012A Strain of muscle, fascia and tendon of left hip, initial encounter: Secondary | ICD-10-CM | POA: Diagnosis not present

## 2019-12-09 DIAGNOSIS — M25551 Pain in right hip: Secondary | ICD-10-CM | POA: Diagnosis not present

## 2019-12-09 DIAGNOSIS — S76011A Strain of muscle, fascia and tendon of right hip, initial encounter: Secondary | ICD-10-CM | POA: Diagnosis not present

## 2019-12-09 DIAGNOSIS — I10 Essential (primary) hypertension: Secondary | ICD-10-CM | POA: Diagnosis not present

## 2019-12-09 DIAGNOSIS — S39012A Strain of muscle, fascia and tendon of lower back, initial encounter: Secondary | ICD-10-CM | POA: Diagnosis not present

## 2019-12-09 DIAGNOSIS — R52 Pain, unspecified: Secondary | ICD-10-CM | POA: Diagnosis not present

## 2019-12-09 DIAGNOSIS — R0902 Hypoxemia: Secondary | ICD-10-CM | POA: Diagnosis not present

## 2019-12-09 DIAGNOSIS — M160 Bilateral primary osteoarthritis of hip: Secondary | ICD-10-CM | POA: Diagnosis not present

## 2019-12-09 DIAGNOSIS — M25552 Pain in left hip: Secondary | ICD-10-CM | POA: Diagnosis not present

## 2019-12-09 DIAGNOSIS — M5489 Other dorsalgia: Secondary | ICD-10-CM | POA: Diagnosis not present

## 2019-12-11 ENCOUNTER — Telehealth: Payer: Self-pay | Admitting: Nurse Practitioner

## 2019-12-11 NOTE — Telephone Encounter (Signed)
Patient was in the ER at [facility]  on Saturday 12/09/19 due to pain in back.  Patient's symptoms have worsened since last visit with pcp.  Now complains of weakness in legs and having to drag legs.  Also states that she was prescribed vicodin 5 and has been taking 1/2 tablet and is getting dizzy.  Patient unable to come into office due to weakness in legs.  MyChart Video Visit made for 12/13/19 with Caren Macadam.  First Available

## 2019-12-13 ENCOUNTER — Telehealth: Payer: Self-pay | Admitting: *Deleted

## 2019-12-13 ENCOUNTER — Encounter: Payer: Self-pay | Admitting: Physician Assistant

## 2019-12-13 ENCOUNTER — Telehealth (INDEPENDENT_AMBULATORY_CARE_PROVIDER_SITE_OTHER): Payer: Medicare HMO | Admitting: Physician Assistant

## 2019-12-13 DIAGNOSIS — M545 Low back pain, unspecified: Secondary | ICD-10-CM

## 2019-12-13 MED ORDER — CYCLOBENZAPRINE HCL 5 MG PO TABS: 5.0000 mg | ORAL_TABLET | Freq: Two times a day (BID) | ORAL | 0 refills | Status: AC

## 2019-12-13 NOTE — Telephone Encounter (Signed)
PA sent for KEY [id number] for cyclobenzaprine. PA approved This approval authorizes your coverage from 08/04/2019 - 03/12/2020. Pharmacy and patient aware.

## 2019-12-13 NOTE — Progress Notes (Signed)
   Virtual Visit via telephone Note Due to COVID-19 pandemic this visit was conducted virtually. This visit type was conducted due to national recommendations for restrictions regarding the COVID-19 Pandemic (e.g. social distancing, sheltering in place) in an effort to limit this patient's exposure and mitigate transmission in our community. All issues noted in this document were discussed and addressed.  A physical exam was not performed with this format.  I connected with Anita Daniels on 12/13/19 at [phone] by telephone and verified that I am speaking with the correct person using two identifiers. Anita Daniels is currently located at home in [facility] and  is currently with her granddaughter during visit. The provider, Caren Macadam, PA-C is located in their office at time of visit.  I discussed the limitations, risks, security and privacy concerns of performing an evaluation and management service by telephone and the availability of in person appointments. I also discussed with the patient that there may be a patient responsible charge related to this service. The patient expressed understanding and agreed to proceed.   History and Present Illness:  HPI Pt with virtual visit last week due to lower back pain Started on Prednisone Sx continued to worsen so pt went to  ER in [place] Pt reports rays were neg Given pain shot in ER and sent home with Vicodin Still having pain but states Vicodin is too string and would like muscle relax She still has several days of the Prednisone left Also relates having some weakness due to the pain Granddaughter from [place] with pt today and is helping with care giving   ROS   Observations/Objective  Assessment and Plan: LB  Plan-  Discussed the SE of the muscle relaxant Rx for Flexeril 5mg  bid prn #14 Heat/Ice Referral for home health to assess needs Granddaughter asked if she could travel to [place] once sx improved Reviewed prolonged sitting  can exacerbate back pain so may need freq breaks for stretching She also asked about future rf of Xanax but has current rx for now Instructed to f/u with her regular provider regarding rf  Follow Up Instructions: If sx continue or weakness increase pt needs in person visit for further eval    I discussed the assessment and treatment plan with the patient. The patient was provided an opportunity to ask questions and all were answered. The patient agreed with the plan and demonstrated an understanding of the instructions.   The patient was advised to call back or seek an in-person evaluation if the symptoms worsen or if the condition fails to improve as anticipated.  The above assessment and management plan was discussed with the patient. The patient verbalized understanding of and has agreed to the management plan. Patient is aware to call the clinic if symptoms persist or worsen. Patient is aware when to return to the clinic for a follow-up visit. Patient educated on when it is appropriate to go to the emergency department.   Time call ended:    I provided 15 minutes of non-face-to-face time during this encounter.    [place], PA-C

## 2019-12-19 ENCOUNTER — Ambulatory Visit (HOSPITAL_COMMUNITY)
Admit: 2019-12-19 | Discharge: 2019-12-19 | Disposition: A | Discharge status: Home / Self Care | Payer: Medicare HMO | Attending: Internal Medicine | Admitting: Internal Medicine

## 2019-12-19 ENCOUNTER — Other Ambulatory Visit: Payer: Self-pay

## 2019-12-19 ENCOUNTER — Encounter (HOSPITAL_COMMUNITY): Payer: Self-pay | Admitting: Emergency Medicine

## 2019-12-19 ENCOUNTER — Observation Stay (HOSPITAL_COMMUNITY): Payer: Medicare HMO

## 2019-12-19 ENCOUNTER — Other Ambulatory Visit (HOSPITAL_COMMUNITY): Payer: Medicare HMO

## 2019-12-19 ENCOUNTER — Emergency Department (HOSPITAL_COMMUNITY): Payer: Medicare HMO

## 2019-12-19 ENCOUNTER — Inpatient Hospital Stay (HOSPITAL_COMMUNITY): Admit: 2019-12-19 | Payer: Medicare HMO

## 2019-12-19 ENCOUNTER — Inpatient Hospital Stay (HOSPITAL_COMMUNITY)
Admission: EM | Admit: 2019-12-19 | Discharge: 2020-01-02 | DRG: 551 | Disposition: E | Payer: Medicare HMO | Attending: Internal Medicine | Admitting: Internal Medicine

## 2019-12-19 DIAGNOSIS — R319 Hematuria, unspecified: Secondary | ICD-10-CM

## 2019-12-19 DIAGNOSIS — E6609 Other obesity due to excess calories: Secondary | ICD-10-CM

## 2019-12-19 DIAGNOSIS — Z888 Allergy status to other drugs, medicaments and biological substances status: Secondary | ICD-10-CM

## 2019-12-19 DIAGNOSIS — L89152 Pressure ulcer of sacral region, stage 2: Secondary | ICD-10-CM | POA: Diagnosis present

## 2019-12-19 DIAGNOSIS — L89101 Pressure ulcer of unspecified part of back, stage 1: Secondary | ICD-10-CM

## 2019-12-19 DIAGNOSIS — I480 Paroxysmal atrial fibrillation: Secondary | ICD-10-CM | POA: Diagnosis present

## 2019-12-19 DIAGNOSIS — G9341 Metabolic encephalopathy: Secondary | ICD-10-CM | POA: Diagnosis present

## 2019-12-19 DIAGNOSIS — N390 Urinary tract infection, site not specified: Secondary | ICD-10-CM | POA: Diagnosis present

## 2019-12-19 DIAGNOSIS — Z7901 Long term (current) use of anticoagulants: Secondary | ICD-10-CM

## 2019-12-19 DIAGNOSIS — I10 Essential (primary) hypertension: Secondary | ICD-10-CM | POA: Diagnosis present

## 2019-12-19 DIAGNOSIS — S3210XA Unspecified fracture of sacrum, initial encounter for closed fracture: Principal | ICD-10-CM | POA: Diagnosis present

## 2019-12-19 DIAGNOSIS — Z79899 Other long term (current) drug therapy: Secondary | ICD-10-CM

## 2019-12-19 DIAGNOSIS — M5442 Lumbago with sciatica, left side: Secondary | ICD-10-CM

## 2019-12-19 DIAGNOSIS — Z6837 Body mass index (BMI) 37.0-37.9, adult: Secondary | ICD-10-CM

## 2019-12-19 DIAGNOSIS — R0902 Hypoxemia: Secondary | ICD-10-CM | POA: Diagnosis not present

## 2019-12-19 DIAGNOSIS — R262 Difficulty in walking, not elsewhere classified: Secondary | ICD-10-CM | POA: Diagnosis not present

## 2019-12-19 DIAGNOSIS — M549 Dorsalgia, unspecified: Secondary | ICD-10-CM | POA: Diagnosis present

## 2019-12-19 DIAGNOSIS — F419 Anxiety disorder, unspecified: Secondary | ICD-10-CM | POA: Diagnosis present

## 2019-12-19 DIAGNOSIS — K219 Gastro-esophageal reflux disease without esophagitis: Secondary | ICD-10-CM | POA: Diagnosis present

## 2019-12-19 DIAGNOSIS — E785 Hyperlipidemia, unspecified: Secondary | ICD-10-CM | POA: Diagnosis present

## 2019-12-19 DIAGNOSIS — I959 Hypotension, unspecified: Secondary | ICD-10-CM | POA: Diagnosis not present

## 2019-12-19 DIAGNOSIS — E559 Vitamin D deficiency, unspecified: Secondary | ICD-10-CM | POA: Diagnosis present

## 2019-12-19 DIAGNOSIS — I110 Hypertensive heart disease with heart failure: Secondary | ICD-10-CM | POA: Diagnosis present

## 2019-12-19 DIAGNOSIS — I5042 Chronic combined systolic (congestive) and diastolic (congestive) heart failure: Secondary | ICD-10-CM | POA: Diagnosis present

## 2019-12-19 DIAGNOSIS — X501XXA Overexertion from prolonged static or awkward postures, initial encounter: Secondary | ICD-10-CM

## 2019-12-19 DIAGNOSIS — Z20822 Contact with and (suspected) exposure to covid-19: Secondary | ICD-10-CM | POA: Diagnosis present

## 2019-12-19 DIAGNOSIS — R52 Pain, unspecified: Secondary | ICD-10-CM | POA: Diagnosis not present

## 2019-12-19 DIAGNOSIS — Z90710 Acquired absence of both cervix and uterus: Secondary | ICD-10-CM

## 2019-12-19 DIAGNOSIS — I469 Cardiac arrest, cause unspecified: Secondary | ICD-10-CM | POA: Diagnosis not present

## 2019-12-19 LAB — URINALYSIS, ROUTINE W REFLEX MICROSCOPIC
Bilirubin Urine: NEGATIVE
Glucose, UA: NEGATIVE mg/dL
Ketones, ur: NEGATIVE mg/dL
Leukocytes,Ua: NEGATIVE
Nitrite: POSITIVE — AB
Protein, ur: NEGATIVE mg/dL
Specific Gravity, Urine: 1.021 (ref 1.005–1.030)
pH: 6 (ref 5.0–8.0)

## 2019-12-19 LAB — CBC WITH DIFFERENTIAL/PLATELET
Abs Immature Granulocytes: 0.39 10*3/uL — ABNORMAL HIGH (ref 0.00–0.07)
Basophils Absolute: 0 10*3/uL (ref 0.0–0.1)
Basophils Relative: 0 %
Eosinophils Absolute: 0.1 10*3/uL (ref 0.0–0.5)
Eosinophils Relative: 0 %
HCT: 46.6 % — ABNORMAL HIGH (ref 36.0–46.0)
Hemoglobin: 15.1 g/dL — ABNORMAL HIGH (ref 12.0–15.0)
Immature Granulocytes: 2 %
Lymphocytes Relative: 15 %
Lymphs Abs: 3.6 10*3/uL (ref 0.7–4.0)
MCH: 31.9 pg (ref 26.0–34.0)
MCHC: 32.4 g/dL (ref 30.0–36.0)
MCV: 98.5 fL (ref 80.0–100.0)
Monocytes Absolute: 3.3 10*3/uL — ABNORMAL HIGH (ref 0.1–1.0)
Monocytes Relative: 14 %
Neutro Abs: 16.2 10*3/uL — ABNORMAL HIGH (ref 1.7–7.7)
Neutrophils Relative %: 69 %
Platelets: 270 10*3/uL (ref 150–400)
RBC: 4.73 MIL/uL (ref 3.87–5.11)
RDW: 14.2 % (ref 11.5–15.5)
WBC Morphology: INCREASED
WBC: 23.5 10*3/uL — ABNORMAL HIGH (ref 4.0–10.5)
nRBC: 0 % (ref 0.0–0.2)

## 2019-12-19 LAB — COMPREHENSIVE METABOLIC PANEL
ALT: 23 U/L (ref 0–44)
AST: 31 U/L (ref 15–41)
Albumin: 3.3 g/dL — ABNORMAL LOW (ref 3.5–5.0)
Alkaline Phosphatase: 140 U/L — ABNORMAL HIGH (ref 38–126)
Anion gap: 9 (ref 5–15)
BUN: 21 mg/dL (ref 8–23)
CO2: 28 mmol/L (ref 22–32)
Calcium: 8.6 mg/dL — ABNORMAL LOW (ref 8.9–10.3)
Chloride: 99 mmol/L (ref 98–111)
Creatinine, Ser: 0.68 mg/dL (ref 0.44–1.00)
GFR calc Af Amer: >60 mL/min (ref >60)
GFR calc non Af Amer: >60 mL/min (ref >60)
Glucose, Bld: 139 mg/dL — ABNORMAL HIGH (ref 70–99)
Potassium: 3.7 mmol/L (ref 3.5–5.1)
Sodium: 136 mmol/L (ref 135–145)
Total Bilirubin: 1.4 mg/dL — ABNORMAL HIGH (ref 0.3–1.2)
Total Protein: 6.5 g/dL (ref 6.5–8.1)

## 2019-12-19 LAB — PHOSPHORUS: Phosphorus: 2.7 mg/dL (ref 2.5–4.6)

## 2019-12-19 LAB — MAGNESIUM: Magnesium: 2 mg/dL (ref 1.7–2.4)

## 2019-12-19 LAB — SARS CORONAVIRUS 2 BY RT PCR (HOSPITAL ORDER, PERFORMED IN ~~LOC~~ HOSPITAL LAB): SARS Coronavirus 2: NEGATIVE

## 2019-12-19 MED ORDER — CYCLOBENZAPRINE HCL 10 MG PO TABS
5.0000 mg | ORAL_TABLET | Freq: Once | ORAL | Status: AC
  Administered 2019-12-19: 5 mg via ORAL
  Filled 2019-12-19: qty 1

## 2019-12-19 MED ORDER — QUETIAPINE FUMARATE 25 MG PO TABS
12.5000 mg | ORAL_TABLET | Freq: Every day | ORAL | Status: DC
  Administered 2019-12-19: 12.5 mg via ORAL
  Filled 2019-12-19 (×2): qty 1

## 2019-12-19 MED ORDER — ATORVASTATIN CALCIUM 20 MG PO TABS
20.0000 mg | ORAL_TABLET | Freq: Every day | ORAL | Status: DC
  Administered 2019-12-19 – 2019-12-20 (×2): 20 mg via ORAL
  Filled 2019-12-19 (×2): qty 1

## 2019-12-19 MED ORDER — FENTANYL CITRATE (PF) 100 MCG/2ML IJ SOLN
25.0000 ug | Freq: Once | INTRAMUSCULAR | Status: AC
  Administered 2019-12-19: 25 ug via INTRAVENOUS
  Filled 2019-12-19: qty 2

## 2019-12-19 MED ORDER — CYCLOBENZAPRINE HCL 10 MG PO TABS
5.0000 mg | ORAL_TABLET | Freq: Two times a day (BID) | ORAL | Status: DC
  Administered 2019-12-19 (×2): 5 mg via ORAL
  Filled 2019-12-19 (×3): qty 1

## 2019-12-19 MED ORDER — LISINOPRIL 10 MG PO TABS: 20.0000 mg | ORAL_TABLET | Freq: Every day | ORAL | Status: DC

## 2019-12-19 MED ORDER — LOSARTAN POTASSIUM 50 MG PO TABS
25.0000 mg | ORAL_TABLET | Freq: Every day | ORAL | Status: DC
  Administered 2019-12-19 – 2019-12-20 (×2): 25 mg via ORAL
  Filled 2019-12-19 (×2): qty 1

## 2019-12-19 MED ORDER — ACETAMINOPHEN 650 MG RE SUPP: 650.0000 mg | Freq: Four times a day (QID) | RECTAL | Status: DC | PRN

## 2019-12-19 MED ORDER — ONDANSETRON HCL 4 MG PO TABS: 4.0000 mg | ORAL_TABLET | Freq: Four times a day (QID) | ORAL | Status: DC | PRN

## 2019-12-19 MED ORDER — POTASSIUM CHLORIDE CRYS ER 10 MEQ PO TBCR
10.0000 meq | EXTENDED_RELEASE_TABLET | Freq: Every day | ORAL | Status: DC
  Administered 2019-12-20: 10 meq via ORAL
  Filled 2019-12-19: qty 1

## 2019-12-19 MED ORDER — PANTOPRAZOLE SODIUM 40 MG PO TBEC
40.0000 mg | DELAYED_RELEASE_TABLET | Freq: Every day | ORAL | Status: DC
  Administered 2019-12-19 – 2019-12-20 (×2): 40 mg via ORAL
  Filled 2019-12-19 (×2): qty 1

## 2019-12-19 MED ORDER — ALPRAZOLAM 0.25 MG PO TABS
0.2500 mg | ORAL_TABLET | Freq: Two times a day (BID) | ORAL | Status: DC | PRN
  Administered 2019-12-20: 0.25 mg via ORAL
  Filled 2019-12-19: qty 1

## 2019-12-19 MED ORDER — CITALOPRAM HYDROBROMIDE 20 MG PO TABS
20.0000 mg | ORAL_TABLET | Freq: Every day | ORAL | Status: DC
  Administered 2019-12-19 – 2019-12-20 (×2): 20 mg via ORAL
  Filled 2019-12-19 (×4): qty 1

## 2019-12-19 MED ORDER — AMIODARONE HCL 200 MG PO TABS
200.0000 mg | ORAL_TABLET | Freq: Every day | ORAL | Status: DC
  Administered 2019-12-19 – 2019-12-20 (×2): 200 mg via ORAL
  Filled 2019-12-19 (×2): qty 1

## 2019-12-19 MED ORDER — POTASSIUM CHLORIDE CRYS ER 20 MEQ PO TBCR
20.0000 meq | EXTENDED_RELEASE_TABLET | Freq: Once | ORAL | Status: AC
  Administered 2019-12-19: 20 meq via ORAL
  Filled 2019-12-19: qty 1

## 2019-12-19 MED ORDER — METHOCARBAMOL 500 MG PO TABS: 750.0000 mg | ORAL_TABLET | Freq: Three times a day (TID) | ORAL | Status: DC | PRN

## 2019-12-19 MED ORDER — APIXABAN 5 MG PO TABS
5.0000 mg | ORAL_TABLET | Freq: Two times a day (BID) | ORAL | Status: DC
  Administered 2019-12-19 – 2019-12-20 (×4): 5 mg via ORAL
  Filled 2019-12-19 (×4): qty 1

## 2019-12-19 MED ORDER — FUROSEMIDE 20 MG PO TABS
20.0000 mg | ORAL_TABLET | Freq: Every day | ORAL | Status: DC
  Administered 2019-12-19 – 2019-12-20 (×2): 20 mg via ORAL
  Filled 2019-12-19 (×2): qty 1

## 2019-12-19 MED ORDER — METOPROLOL TARTRATE 50 MG PO TABS
25.0000 mg | ORAL_TABLET | Freq: Two times a day (BID) | ORAL | Status: DC
  Administered 2019-12-19 – 2019-12-20 (×3): 25 mg via ORAL
  Filled 2019-12-19 (×4): qty 1

## 2019-12-19 MED ORDER — OXYCODONE HCL 5 MG PO TABS
5.0000 mg | ORAL_TABLET | Freq: Four times a day (QID) | ORAL | Status: DC | PRN
  Administered 2019-12-19 – 2019-12-20 (×2): 5 mg via ORAL
  Filled 2019-12-19 (×2): qty 1

## 2019-12-19 MED ORDER — ONDANSETRON HCL 4 MG/2ML IJ SOLN: 4.0000 mg | Freq: Four times a day (QID) | INTRAMUSCULAR | Status: DC | PRN

## 2019-12-19 MED ORDER — ACETAMINOPHEN 325 MG PO TABS
650.0000 mg | ORAL_TABLET | Freq: Four times a day (QID) | ORAL | Status: DC | PRN
  Filled 2019-12-19: qty 2

## 2019-12-19 MED ORDER — LORAZEPAM 1 MG PO TABS
1.0000 mg | ORAL_TABLET | Freq: Once | ORAL | Status: AC
  Administered 2019-12-19: 1 mg via ORAL
  Filled 2019-12-19: qty 1

## 2019-12-19 MED ORDER — MAGNESIUM OXIDE 400 (241.3 MG) MG PO TABS
400.0000 mg | ORAL_TABLET | Freq: Every day | ORAL | Status: DC
  Administered 2019-12-19 – 2019-12-20 (×2): 400 mg via ORAL
  Filled 2019-12-19 (×2): qty 1

## 2019-12-19 NOTE — TOC Initial Note (Signed)
Transition of Care [facility]) - Initial/Assessment Note    Patient Details  Name: Anita Daniels MRN: [id number] Date of Birth: 1935-11-05  Transition of Care [facility]) CM/SW Contact:    Boneta Lucks, RN Phone Number: 12/17/2019, 2:30 PM  Clinical Narrative:   Patient admitted with intractable back pain. PT is recommending SNF. Patient is sleeping, TOC spoke with Daughter - Edd Fabian. She agreed with SNF and has discussed with the patient. Choices given Will review bed offers when they come in. Sent out FL2 and PT notes.                 Expected Discharge Plan: Skilled Nursing Facility Barriers to Discharge: Continued Medical Work up   Patient Goals and CMS Choice Patient states their goals for this hospitalization and ongoing recovery are:: to go to SNF then return home. CMS Medicare.gov Compare Post Acute Care list provided to:: Patient Represenative (must comment) Choice offered to / list presented to : Adult Children  Expected Discharge Plan and Services Expected Discharge Plan: [place] Acute Care Choice: [place] Living arrangements for the past 2 months: Single Family Home                       Prior Living Arrangements/Services Living arrangements for the past 2 months: Single Family Home Lives with:: Self Patient language and need for interpreter reviewed:: Yes        Need for Family Participation in Patient Care: Yes (Comment) Care giver support system in place?: Yes (comment)   Criminal Activity/Legal Involvement Pertinent to Current Situation/Hospitalization: No - Comment as needed  Activities of Daily Living Home Assistive Devices/Equipment: Cane (specify quad or straight) ADL Screening (condition at time of admission) Patient's cognitive ability adequate to safely complete daily activities?: No Is the patient deaf or have difficulty hearing?: Yes Does the patient have difficulty seeing, even when wearing  glasses/contacts?: No Does the patient have difficulty concentrating, remembering, or making decisions?: No Patient able to express need for assistance with ADLs?: Yes Does the patient have difficulty dressing or bathing?: No Independently performs ADLs?: Yes (appropriate for developmental age) Does the patient have difficulty walking or climbing stairs?: No Weakness of Legs: None Weakness of Arms/Hands: None  Permission Sought/Granted      Permission granted to share info w Contact Information: SNF  Emotional Assessment      Alcohol / Substance Use: Not Applicable Psych Involvement: No (comment)  Admission diagnosis:  Unable to walk [R26.2] Intractable back pain [M54.9] Urinary tract infection with hematuria, site unspecified [N39.0, R31.9] Acute left-sided low back pain with left-sided sciatica [M54.42] Patient Active Problem List   Diagnosis Date Noted  . Class 2 obesity due to excess calories with body mass index (BMI) of 37.0 to 37.9 in adult 12/27/2019  . Intractable back pain 12/05/2019  . Pressure injury of lower back, stage 1 12/27/2019  . Paroxysmal atrial fibrillation ([place]) 12/25/2019  . Chronic combined systolic and diastolic heart failure ([place]) 03/29/2019  . Longstanding persistent atrial fibrillation ([place]) 03/16/2019  . Anxiety 03/16/2019  . Essential hypertension 12/09/2018  . Hyperlipidemia 12/09/2018  . Gastroesophageal reflux disease 12/09/2018  . Obesity (BMI 30-39.9) 12/09/2018   PCP:  Chevis Pretty, FNP Pharmacy:   [id number] [place] [place] [address] [place] [address] Phone: [phone] Fax: [phone]  [facility] [phone] - [place], [place] -  [address] AT [facility] [address] [place] [address] Phone: [phone] Fax: [phone]

## 2019-12-19 NOTE — Progress Notes (Signed)
Patient transported back to room via carelink. Patient alert and oriented. Patient cleaned up by nursing tech, new purewick in place. Dinner tray provided for patient and she is alert and eating. Daughter at bedside. No needs at this time.

## 2019-12-19 NOTE — Plan of Care (Signed)

## 2019-12-19 NOTE — ED Triage Notes (Signed)
Patient brought in by EMS for pain from previous muscle strain. Patient has been having discomfort from her hips and her back. Patient was initially diagnosed with muscle strain at [facility] om 12/10/19 and prescribed hydrocodone for pain. Patient states pain upon touch and states pain medication helped some but did not take all the pain away.

## 2019-12-19 NOTE — Plan of Care (Signed)
  Problem: Acute Rehab PT Goals(only PT should resolve) Goal: Pt Will Go Supine/Side To Sit Outcome: Progressing Flowsheets (Taken 01/01/2020 1036) Pt will go Supine/Side to Sit:  with minimal assist  with moderate assist Goal: Patient Will Transfer Sit To/From Stand Outcome: Progressing Flowsheets (Taken 12/02/2019 1036) Patient will transfer sit to/from stand: with moderate assist Goal: Pt Will Transfer Bed To Chair/Chair To Bed Outcome: Progressing Flowsheets (Taken 12/17/2019 1036) Pt will Transfer Bed to Chair/Chair to Bed: with mod assist Goal: Pt Will Ambulate Outcome: Progressing Flowsheets (Taken 12/17/2019 1036) Pt will Ambulate:  15 feet  with moderate assist  with rolling walker   10:37 AM, 12/19/2019 Ocie Bob, MPT Physical Therapist with [facility] [phone] office [phone] mobile phone

## 2019-12-19 NOTE — ED Provider Notes (Addendum)
[facility] EMERGENCY DEPARTMENT Provider Note   CSN: [id number] Arrival date & time: 12/13/2019  0346   Time seen 3:55 AM  History Chief Complaint  Patient presents with  . Muscle Pain    hip, back pain    Anita Daniels is a 84 y.o. female.  HPI   Patient initially told me last Wednesday, May 12 and then later stated it was the Wednesday before, May 5 she was bringing a plant into the house and she was walking down some steps and she twisted to the left and dropped the plant.  She did not fall.  She then bent over and picked up the plant.  She denies having any pain at that time but states the pain started a couple days after that.  She states initially the pain was on her left back but it moves around and currently is in her right back.  She states it was on her left side about 4 days ago.  She states she went to [facility] ED on May 9 and she states she had an MRI of her back done.  However when I look at her discharge papers it appears she had a lumbosacral CT scan, pelvis CT scan, and lower extremity CT scanning x2.  She was prescribed hydrocodone.  She states she only takes a half which helps her pain and she states if she takes to hold when she gets [place] all over.  She states any movement or touching of her left leg causes pain.  However she specifically tells me her leg does not hurt.  She states that people have to move her leg by lifting at at the left knee for it to not hurt.  She states if she is lying still she does not have pain but if she moves she has sharp pain.  She also states she gets pain if she sits for a long period of time or stands for long period of time.  She states she has been putting foot cream and Lotrimin powder on her back which with improvement.  She denies that the back pain radiates into her legs.  She also states 3 to 4 days ago she noted her urine is getting cloudy but she denies dysuria, frequency, or hematuria.  She does have urgency.  She denies rectal  incontinence.  She denies fever.  She states she has been using a cane.  She states tonight she was trying to roll over in bed and her daughter who was very small was unable to help her and she was having a lot of pain.  PCP Bennie Pierini, FNP  Past Medical History:  Diagnosis Date  . Anxiety 03/16/2019  . Chronic combined systolic and diastolic heart failure (HCC) 03/29/2019  . Gastroesophageal reflux disease 12/09/2018  . Hyperlipidemia   . Hypertension     Patient Active Problem List   Diagnosis Date Noted  . Class 2 obesity due to excess calories with body mass index (BMI) of 37.0 to 37.9 in adult 20-Dec-2019  . Intractable back pain 12/16/2019  . Pressure injury of lower back, stage 1 20-Dec-2019  . Paroxysmal atrial fibrillation (HCC) 12/23/2019  . Chronic combined systolic and diastolic heart failure (HCC) 03/29/2019  . Longstanding persistent atrial fibrillation (HCC) 03/16/2019  . Anxiety 03/16/2019  . Essential hypertension 12/09/2018  . Hyperlipidemia 12/09/2018  . Gastroesophageal reflux disease 12/09/2018  . Obesity (BMI 30-39.9) 12/09/2018    Past Surgical History:  Procedure Laterality Date  . ABDOMINAL  HYSTERECTOMY    . PARTIAL KNEE ARTHROPLASTY       OB History   No obstetric history on file.     Family History  Problem Relation Age of Onset  . AAA (abdominal aortic aneurysm) Mother     Social History   Tobacco Use  . Smoking status: Never Smoker  . Smokeless tobacco: Never Used  Substance Use Topics  . Alcohol use: No  . Drug use: No  lives at home Uses a cane  Home Medications Prior to Admission medications   Medication Sig Start Date End Date Taking? Authorizing Provider  ALPRAZolam (XANAX) 0.25 MG tablet Take 1 tablet (0.25 mg total) by mouth 2 (two) times daily as needed for anxiety. 11/23/19   Bennie Pierini, FNP  amiodarone (PACERONE) 400 MG tablet  04/21/19   [provider]  apixaban (ELIQUIS) 5 MG TABS tablet Take  1 tablet (5 mg total) by mouth 2 (two) times daily. 11/21/19   Daphine Deutscher, Mary-Margaret, FNP  atorvastatin (LIPITOR) 20 MG tablet Take 1 tablet (20 mg total) by mouth daily. 10/16/19   Daphine Deutscher, Mary-Margaret, FNP  citalopram (CELEXA) 20 MG tablet Take 1 tablet (20 mg total) by mouth daily. 10/16/19   Daphine Deutscher, Mary-Margaret, FNP  cyclobenzaprine (FLEXERIL) 5 MG tablet Take 1 tablet (5 mg total) by mouth in the morning and at bedtime. 12/13/19   Inis Sizer, PA-C  furosemide (LASIX) 20 MG tablet Take 1 tablet (20 mg total) by mouth daily. 10/16/19   Daphine Deutscher, Mary-Margaret, FNP  lisinopril (ZESTRIL) 20 MG tablet Take 1 tablet (20 mg total) by mouth daily. 10/16/19   Daphine Deutscher, Mary-Margaret, FNP  magnesium oxide (MAG-OX) 400 MG tablet TAKE 1 TABLET BY MOUTH EVERY DAY 09/19/19   Daphine Deutscher, Mary-Margaret, FNP  metoprolol tartrate (LOPRESSOR) 25 MG tablet Take by mouth. 08/28/19 08/27/20  [provider]  pantoprazole (PROTONIX) 40 MG tablet Take 1 tablet (40 mg total) by mouth daily. 10/16/19   Daphine Deutscher, Mary-Margaret, FNP  predniSONE (STERAPRED UNI-PAK 21 TAB) 10 MG (21) TBPK tablet As directed x 6 days 12/08/19   Bennie Pierini, FNP    Allergies    Iodine  Review of Systems   Review of Systems  All other systems reviewed and are negative.   Physical Exam Updated Vital Signs BP 107/67   Pulse 74   Temp 99 F (37.2 C) (Oral)   Resp 15   Ht 5\' 9"  (1.753 m)   Wt 115.7 kg   SpO2 92%   BMI 37.66 kg/m   Physical Exam Vitals and nursing note reviewed.  Constitutional:      General: She is not in acute distress.    Appearance: Normal appearance. She is obese.  HENT:     Head: Normocephalic and atraumatic.     Right Ear: External ear normal.     Left Ear: External ear normal.  Eyes:     Extraocular Movements: Extraocular movements intact.     Conjunctiva/sclera: Conjunctivae normal.  Cardiovascular:     Rate and Rhythm: Normal rate.  Pulmonary:     Effort: Pulmonary effort is normal.  No respiratory distress.  Genitourinary:    Comments: Patient has redness with discrete borders on the medial aspect of both buttocks, it has the appearance of a tenia infection.  The area the daughter was concerned was a breakdown that is in the area superior to the cleft of the buttocks. Musculoskeletal:     Cervical back: Normal range of motion.  Back:     Comments: Patient is nontender to palpation in her thoracic or lumbar spine, she has some tenderness in the midline sacral area.  She states currently she is having pain that appears to be over the right sciatic notch.  On lumbar range of motion she states leaning to the left makes it hurt more on the right, leaning to the right is not painful.  Patient is unable to straight leg raise either leg off the stretcher.  When I try to check her patellar reflexes there 0 bilaterally.  There is no signs of infection in her skin of her lower legs, no redness no warmth.  Skin:    General: Skin is warm and dry.     Comments: Patient is noted to have very dry flaky skin.  Neurological:     General: No focal deficit present.     Mental Status: She is alert and oriented to person, place, and time.     Cranial Nerves: No cranial nerve deficit.  Psychiatric:        Mood and Affect: Mood normal.        Behavior: Behavior normal.        Thought Content: Thought content normal.     Comments: Patient does get upset with me when I ask her questions about her problems, however I have explained to her I am trying to understand what is going on with her tonight.          ED Results / Procedures / Treatments   Labs (all labs ordered are listed, but only abnormal results are displayed) Results for orders placed or performed during the hospital encounter of 12/29/2019  Urinalysis, Routine w reflex microscopic  Result Value Ref Range   Color, Urine AMBER (A) YELLOW   APPearance HAZY (A) CLEAR   Specific Gravity, Urine 1.021 1.005 - 1.030   pH 6.0  5.0 - 8.0   Glucose, UA NEGATIVE NEGATIVE mg/dL   Hgb urine dipstick LARGE (A) NEGATIVE   Bilirubin Urine NEGATIVE NEGATIVE   Ketones, ur NEGATIVE NEGATIVE mg/dL   Protein, ur NEGATIVE NEGATIVE mg/dL   Nitrite POSITIVE (A) NEGATIVE   Leukocytes,Ua NEGATIVE NEGATIVE   RBC / HPF 21-50 0 - 5 RBC/hpf   WBC, UA 6-10 0 - 5 WBC/hpf   Bacteria, UA RARE (A) NONE SEEN   Squamous Epithelial / LPF 0-5 0 - 5   Mucus PRESENT    Budding Yeast PRESENT   Comprehensive metabolic panel  Result Value Ref Range   Sodium 136 135 - 145 mmol/L   Potassium 3.7 3.5 - 5.1 mmol/L   Chloride 99 98 - 111 mmol/L   CO2 28 22 - 32 mmol/L   Glucose, Bld 139 (H) 70 - 99 mg/dL   BUN 21 8 - 23 mg/dL   Creatinine, Ser 1.610.68 0.44 - 1.00 mg/dL   Calcium 8.6 (L) 8.9 - 10.3 mg/dL   Total Protein 6.5 6.5 - 8.1 g/dL   Albumin 3.3 (L) 3.5 - 5.0 g/dL   AST 31 15 - 41 U/L   ALT 23 0 - 44 U/L   Alkaline Phosphatase 140 (H) 38 - 126 U/L   Total Bilirubin 1.4 (H) 0.3 - 1.2 mg/dL   GFR calc non Af Amer >60 >60 mL/min   GFR calc Af Amer >60 >60 mL/min   Anion gap 9 5 - 15  CBC with Differential  Result Value Ref Range   WBC 23.5 (H) 4.0 - 10.5 K/uL  RBC 4.73 3.87 - 5.11 MIL/uL   Hemoglobin 15.1 (H) 12.0 - 15.0 g/dL   HCT 46.6 (H) 36.0 - 46.0 %   MCV 98.5 80.0 - 100.0 fL   MCH 31.9 26.0 - 34.0 pg   MCHC 32.4 30.0 - 36.0 g/dL   RDW 14.2 11.5 - 15.5 %   Platelets 270 150 - 400 K/uL   nRBC 0.0 0.0 - 0.2 %   Neutrophils Relative % 69 %   Neutro Abs 16.2 (H) 1.7 - 7.7 K/uL   Lymphocytes Relative 15 %   Lymphs Abs 3.6 0.7 - 4.0 K/uL   Monocytes Relative 14 %   Monocytes Absolute 3.3 (H) 0.1 - 1.0 K/uL   Eosinophils Relative 0 %   Eosinophils Absolute 0.1 0.0 - 0.5 K/uL   Basophils Relative 0 %   Basophils Absolute 0.0 0.0 - 0.1 K/uL   WBC Morphology INCREASED BANDS (>20% BANDS)    Immature Granulocytes 2 %   Abs Immature Granulocytes 0.39 (H) 0.00 - 0.07 K/uL  Magnesium  Result Value Ref Range   Magnesium 2.0 1.7 -  2.4 mg/dL  Phosphorus  Result Value Ref Range   Phosphorus 2.7 2.5 - 4.6 mg/dL   Laboratory interpretation all normal except probable UTI, nonfasting hyperglycemia, leukocytosis    EKG None  Radiology No results found.         Procedures Procedures (including critical care time)  Medications Ordered in ED Medications  ALPRAZolam (XANAX) tablet 0.25 mg (has no administration in time range)  apixaban (ELIQUIS) tablet 5 mg (has no administration in time range)  atorvastatin (LIPITOR) tablet 20 mg (has no administration in time range)  citalopram (CELEXA) tablet 20 mg (has no administration in time range)  cyclobenzaprine (FLEXERIL) tablet 5 mg (has no administration in time range)  furosemide (LASIX) tablet 20 mg (has no administration in time range)  pantoprazole (PROTONIX) EC tablet 40 mg (has no administration in time range)  amiodarone (PACERONE) tablet 200 mg (has no administration in time range)  magnesium oxide (MAG-OX) tablet 400 mg (has no administration in time range)  metoprolol tartrate (LOPRESSOR) tablet 25 mg (has no administration in time range)  losartan (COZAAR) tablet 25 mg (has no administration in time range)  ondansetron (ZOFRAN) tablet 4 mg (has no administration in time range)    Or  ondansetron (ZOFRAN) injection 4 mg (has no administration in time range)  acetaminophen (TYLENOL) tablet 650 mg (has no administration in time range)    Or  acetaminophen (TYLENOL) suppository 650 mg (has no administration in time range)  cyclobenzaprine (FLEXERIL) tablet 5 mg (5 mg Oral Given 12/25/2019 0424)  fentaNYL (SUBLIMAZE) injection 25 mcg (25 mcg Intravenous Given 12/26/2019 0628)  LORazepam (ATIVAN) tablet 1 mg (1 mg Oral Given 12/15/2019 2671)    ED Course  I have reviewed the triage vital signs and the nursing notes.  Pertinent labs & imaging results that were available during my care of the patient were reviewed by me and considered in my medical decision  making (see chart for details).    MDM Rules/Calculators/A&P                     Radiology techs were able to pull up the CT reports from May 8/9.  CT lumbar spine without contrast shows 5 lumbar type vertebrae with levocurvature centered at L3.  There is no acute fracture, vertebral body hemangiomas were seen at L2 and L4, she was incidentally noted to have gallstones  in the gallbladder.  She had severe multilevel degenerative disks and joint disease most significant at L3/4 and L4/5 this results in severe neuroforaminal stenosis at L4/5 on the left.  Impression was 1 no acute osseous abnormality 2 multilevel degenerative disc and joint disease CT of the bilateral lower extremities without contrast shows osteopenia, severe osteoarthritic changes of the right knee, partially visualized left knee hemiarthroplasty, and mild right and moderate left hip osteoarthritic changes.  Impression no acute fracture or dislocation.  05:15 AM recheck, daughter in room now.  She states she lives in [place] [facility] [facility] and is just here trying to to help take care of her mother.  States patient is not ambulatory now and is getting breakdown in her sacrum   05:34 AM Dr Robb Matar requests an MRI of her lumbar spine be ordered and he will admit for placement to rehab.  7:25 AM patient CBC has resulted and she has a marked leukocytosis, she has however been on steroids recently for her back.  She did have a low-grade temp so we will have nursing staff recheck her temperature.   Patient states she took the [employer] vaccine and had two injections, last about 2 months ago  Final Clinical Impression(s) / ED Diagnoses Final diagnoses:  Acute left-sided low back pain with left-sided sciatica  Urinary tract infection with hematuria, site unspecified  Unable to walk    Rx / DC Orders  Plan admission  Devoria Albe, MD, Concha Pyo, MD 12/04/2019 8937    Devoria Albe, MD 12/09/2019 2300

## 2019-12-19 NOTE — ED Notes (Signed)
Report to Morgan, RN 300 

## 2019-12-19 NOTE — H&P (Addendum)
History and Physical    Anita Daniels SNK:[id number] DOB: October 05, 1935 DOA: January 18, 2020  PCP: Chevis Pretty, FNP   Patient coming from: Home.  I have personally briefly reviewed patient's old medical records in [place]  Chief Complaint: Back pain.  HPI: Anita Daniels is a 84 y.o. female with medical history significant of anxiety, chronic combined systolic and diastolic heart failure, GERD, hyperlipidemia, hypertension, paroxysmal atrial fibrillation who is coming to the emergency department due to progressively worse back pain since she nearly fell on a 2 step stairs while holding her cane and carrying a bag and some flowers with her other hand.  She mentions she felt like she twisted to her left.  After this, she did not had any mediated symptoms, but 2 days later she developed intense pain in her lumbosacral area.  She went to [facility] ED had a lumbosacral CT scan, pelvic CT and lower CT scan, which did not show any acute abnormality.  She was discharged home with hydrocodone.  She has been taking half a tablet, but states that it makes her feel tremulous.  She has been staying in bed to the point of developing pressure injury on her back due to pain intensity.  He has been treating this with a foot cream and Lotrimin powder, which she says has improved the area.  She denies pain radiating down her legs, fecal or urinary incontinence.  No dysuria, frequency or hematuria, but she feels she has to go often.  She denies fever, chills, rhinorrhea, sore throat, wheezing, or hemoptysis.  She gets dyspnea on exertion.  No chest pain, palpitations, dizziness, diaphoresis, PND, orthopnea or pitting edema of the lower extremities.  She had an episode of diarrhea 3 days ago, but denies abdominal pain, nausea, emesis, constipation, melena or hematochezia.  No polyuria, polydipsia, polyphagia or blurred vision.  ED Course: Initial vital signs temperature 99 F, pulse 85, respirations 18, O2  sat 100% on room air and blood pressure 105/65 mmHg.  Patient was given fentanyl 25 mcg IVP and Flexeril 5 mg p.o. oral.  Urinalysis has a hazy appearance with large hemoglobinuria, positive nitrites, 21-50 RBC and 6-10 WBC per hpf with rare bacteria on microscopic examination.  CMP, CBC, urine culture and MRI without contrast of the lumbosacral spine are still pending.  I will sign out pending results to incoming dayshift provider.  Review of Systems: As per HPI otherwise all other systems reviewed and are negative.  Past Medical History:  Diagnosis Date  . Anxiety 03/16/2019  . Chronic combined systolic and diastolic heart failure ([place]) 03/29/2019  . Gastroesophageal reflux disease 12/09/2018  . Hyperlipidemia   . Hypertension     Past Surgical History:  Procedure Laterality Date  . ABDOMINAL HYSTERECTOMY    . PARTIAL KNEE ARTHROPLASTY      Social History  reports that she has never smoked. She has never used smokeless tobacco. She reports that she does not drink alcohol or use drugs.  Allergies  Allergen Reactions  . Iodine Shortness Of Breath and Rash    Family History  Problem Relation Age of Onset  . AAA (abdominal aortic aneurysm) Mother    Prior to Admission medications   Medication Sig Start Date End Date Taking? Authorizing Provider  ALPRAZolam (XANAX) 0.25 MG tablet Take 1 tablet (0.25 mg total) by mouth 2 (two) times daily as needed for anxiety. 11/23/19   Chevis Pretty, FNP  amiodarone (PACERONE) 400 MG tablet  04/21/19   [provider]  apixaban (ELIQUIS) 5 MG TABS tablet Take 1 tablet (5 mg total) by mouth 2 (two) times daily. 11/21/19   Daphine Deutscher, Mary-Margaret, FNP  atorvastatin (LIPITOR) 20 MG tablet Take 1 tablet (20 mg total) by mouth daily. 10/16/19   Daphine Deutscher, Mary-Margaret, FNP  citalopram (CELEXA) 20 MG tablet Take 1 tablet (20 mg total) by mouth daily. 10/16/19   Daphine Deutscher, Mary-Margaret, FNP  cyclobenzaprine (FLEXERIL) 5 MG tablet Take 1 tablet (5 mg  total) by mouth in the morning and at bedtime. 12/13/19   Inis Sizer, PA-C  furosemide (LASIX) 20 MG tablet Take 1 tablet (20 mg total) by mouth daily. 10/16/19   Daphine Deutscher, Mary-Margaret, FNP  lisinopril (ZESTRIL) 20 MG tablet Take 1 tablet (20 mg total) by mouth daily. 10/16/19   Daphine Deutscher, Mary-Margaret, FNP  magnesium oxide (MAG-OX) 400 MG tablet TAKE 1 TABLET BY MOUTH EVERY DAY 09/19/19   Daphine Deutscher, Mary-Margaret, FNP  metoprolol tartrate (LOPRESSOR) 25 MG tablet Take by mouth. 08/28/19 08/27/20  [provider]  pantoprazole (PROTONIX) 40 MG tablet Take 1 tablet (40 mg total) by mouth daily. 10/16/19   Bennie Pierini, FNP  predniSONE (STERAPRED UNI-PAK 21 TAB) 10 MG (21) TBPK tablet As directed x 6 days 12/08/19   Bennie Pierini, FNP    Physical Exam: Vitals:   12/30/2019 0344 12/29/2019 0345 12/03/2019 0400 12/30/2019 0600  BP: 105/65  90/66   Pulse: 85  77 73  Resp: 18   18  Temp: 99 F (37.2 C)     TempSrc: Oral     SpO2: 100%  97% 94%  Weight:  115.7 kg    Height:  5\' 9"  (1.753 m)      Constitutional: NAD, calm, comfortable Eyes: PERRL, lids and conjunctivae normal ENMT: Mucous membranes are moist. Posterior pharynx clear of any exudate or lesions. Neck: normal, supple, no masses, no thyromegaly Respiratory: clear to auscultation bilaterally, no wheezing, no crackles. Normal respiratory effort. No accessory muscle use.  Cardiovascular: Regular rate and rhythm, no murmurs / rubs / gallops. No extremity edema. 2+ pedal pulses. No carotid bruits.  Abdomen: Morbidly obese.  Nondistended.  BS positive.  Soft, no tenderness, no masses palpated. No hepatosplenomegaly.  Musculoskeletal: no clubbing / cyanosis. Good ROM, no contractures. Normal muscle tone.  Skin: Desquamation of frontal area skin and scalp.  Stage I sacral area pressure ulcer.  Please see pictures below. Neurologic: CN 2-12 grossly intact. Sensation intact, DTR normal. Strength 5/5 in all 4.  Psychiatric:  Normal judgment and insight. Alert and oriented x 3. Normal mood.         Labs on Admission: I have personally reviewed following labs and imaging studies  CBC: No results for input(s): WBC, NEUTROABS, HGB, HCT, MCV, PLT in the last 168 hours.  Basic Metabolic Panel: No results for input(s): NA, K, CL, CO2, GLUCOSE, BUN, CREATININE, CALCIUM, MG, PHOS in the last 168 hours.  GFR: CrCl cannot be calculated (Patient's most recent lab result is older than the maximum 21 days allowed.).  Liver Function Tests: No results for input(s): AST, ALT, ALKPHOS, BILITOT, PROT, ALBUMIN in the last 168 hours.  Urine analysis:    Component Value Date/Time   COLORURINE AMBER (A) 12/22/2019 0439   APPEARANCEUR HAZY (A) 12/03/2019 0439   LABSPEC 1.021 12/06/2019 0439   PHURINE 6.0 12/04/2019 0439   GLUCOSEU NEGATIVE 12/14/2019 0439   HGBUR LARGE (A) 12/14/2019 0439   BILIRUBINUR NEGATIVE 12/19/2019 0439   KETONESUR NEGATIVE 12/19/2019 0439   PROTEINUR NEGATIVE  12/25/2019 0439   NITRITE POSITIVE (A) 01/01/2020 0439   LEUKOCYTESUR NEGATIVE 12/28/2019 0439    Radiological Exams on Admission: No results found.  EKG: Independently reviewed.   Assessment/Plan Principal problem:   Intractable back pain Has been mostly bedridden in the past few days. Observation/telemetry. Methocarbamol 750 every 8 hours as needed. Oxycodone 5 mg every 6 hours as needed. Consult PT and OT. Consult TOC team. Check MRI of the lumbosacral spine later today.  Active Problems:   Pressure injury of lower back, stage 1 Continue local care. Preventive measures.    Chronic combined systolic and diastolic heart failure (HCC) Continue furosemide 20 mg p.o. daily. Continue losartan 25 mg p.o. daily. Continue metoprolol 25 mg p.o. twice daily. Monitor BP, HR, renal function electrolytes.    Paroxysmal atrial fibrillation (HCC) CHA?DS?-VASc Score of at least 5. Continue amiodarone 100 mg p.o. daily. Continue  apixaban 5 mg p.o. twice daily. Continue metoprolol 25 mg p.o. twice daily.    Essential hypertension Continue furosemide 20 mg p.o. daily. Continue losartan 25 mg p.o. daily. Continue metoprolol 25 mg p.o. twice daily. Monitor BP, HR, renal function electrolytes.    Hyperlipidemia Continue atorvastatin 20 mg p.o. daily.    Gastroesophageal reflux disease Continue pantoprazole 40 mg p.o. daily.    Anxiety Continue Celexa 20 mg p.o. daily. Continue alprazolam 0.25 mg p.o. twice daily as needed.    Class 2 obesity due to excess calories with body mass index (BMI) of 37.0 to 37.9 in adult Lifestyle modifications.    DVT prophylaxis: Apixaban. Code Status:   Full code. Family Communication:   Disposition Plan:   Patient is from:  Home.  Anticipated DC to:  Home.  Anticipated DC date:  To be determined.  Anticipated DC barriers: Clinical improvement. Consults called:  PT/OT and TOC team. Admission status:  Observation/telemetry.  Severity of Illness:  Bobette Mo MD [facility]  How to contact the [facility] Attending or Consulting provider 7A - 7P or covering provider during after hours 7P -7A, for this patient?   1. Check the care team in [facility] and look for a) attending/consulting TR[facility] provider listed and b) the [facility] team listed 2. Log into [email or web address] and use 's universal password to access. If you do not have the password, please contact the hospital operator. 3. Locate the [facility] provider you are looking for under [facility] and page to a number that you can be directly reached. 4. If you still have difficulty reaching the provider, please page the [facility] (Director on Call) for the Hospitalists listed on amion for assistance.  12/08/2019, 6:22 AM   This document was prepared using Dragon voice recognition software and may contain some unintended transcription errors.

## 2019-12-19 NOTE — Progress Notes (Signed)
Patient seen and examined admitted after midnight secondary to ongoing intractable lower back pain and inability to walk.  Also expressing some increased frequency and mild burning sensation.  Hemodynamically stable; no fever.  Work-up so far demonstrating UTI without hematuria and ongoing lower back pain with physical deconditioning.  Please refer to H&P written by Dr. Robb Matar on 12/18/2019 for further info/details.  Plan: -pursuit lumbosacral MRI without contrast. -Continue as needed analgesics and follow evaluation/recommendations by PT and OT. -Continue IV Rocephin empirically and follow urine cultures results for speciation and sensitivity. -Continue supportive care. -Patient is a very high risk for hospital-acquired delirium; will to start low-dose Seroquel to assist preventing and treating this problem.  Vassie Loll MD [phone]

## 2019-12-19 NOTE — Evaluation (Signed)
Physical Therapy Evaluation Patient Details Name: Anita Daniels MRN: [id number] DOB: 1936-04-22 Today's Date: 12/15/2019   History of Present Illness  Anita Daniels is a 84 y.o. female with medical history significant of anxiety, chronic combined systolic and diastolic heart failure, GERD, hyperlipidemia, hypertension, paroxysmal atrial fibrillation who is coming to the emergency department due to progressively worse back pain since she nearly fell on a 2 step stairs while holding her cane and carrying a bag and some flowers with her other hand.  She mentions she felt like she twisted to her left.  After this, she did not had any mediated symptoms, but 2 days later she developed intense pain in her lumbosacral area.  She went to [facility] ED had a lumbosacral CT scan, pelvic CT and lower CT scan, which did not show any acute abnormality.  She was discharged home with hydrocodone.  She has been taking half a tablet, but states that it makes her feel tremulous.  She has been staying in bed to the point of developing pressure injury on her back due to pain intensity.  He has been treating this with a foot cream and Lotrimin powder, which she says has improved the area.  She denies pain radiating down her legs, fecal or urinary incontinence.  No dysuria, frequency or hematuria, but she feels she has to go often.  She denies fever, chills, rhinorrhea, sore throat, wheezing, or hemoptysis.  She gets dyspnea on exertion.  No chest pain, palpitations, dizziness, diaphoresis, PND, orthopnea or pitting edema of the lower extremities.  She had an episode of diarrhea 3 days ago, but denies abdominal pain, nausea, emesis, constipation, melena or hematochezia.  No polyuria, polydipsia, polyphagia or blurred vision.    Clinical Impression  Patient limited mostly due to c/o severe pain posterior right hip requiring Mod/Max assistance to sit up at bedside.  Patient unable to attempt sit to stands due to c/o severe hip  pain and required much time and Max assist to move RLE during sit to supine.  Patient will benefit from continued physical therapy in hospital and recommended venue below to increase strength, balance, endurance for safe ADLs and gait.     Follow Up Recommendations SNF    Equipment Recommendations  None recommended by PT    Recommendations for Other Services       Precautions / Restrictions Precautions Precautions: Fall Restrictions Weight Bearing Restrictions: No      Mobility  Bed Mobility Overal bed mobility: Needs Assistance Bed Mobility: Supine to Sit;Sit to Supine     Supine to sit: Mod assist;Max assist Sit to supine: Max assist   General bed mobility comments: poor tolerance for moving RLE due to increasing pain  Transfers                    Ambulation/Gait                Stairs            Wheelchair Mobility    Modified Rankin (Stroke Patients Only)       Balance Overall balance assessment: Needs assistance Sitting-balance support: Feet supported;No upper extremity supported Sitting balance-Leahy Scale: Fair Sitting balance - Comments: fair/good seated at EOB                                     Pertinent Vitals/Pain Pain Assessment: 0-10 Pain Score: 6  Pain Location: posterior right hip Pain Descriptors / Indicators: Aching;Sore;Grimacing;Guarding Pain Intervention(s): Limited activity within patient's tolerance;Monitored during session;Repositioned    Home Living Family/patient expects to be discharged to:: Private residence Living Arrangements: Alone Available Help at Discharge: Other (Comment)(Patient states she has no help) Type of Home: House Home Access: Stairs to enter Entrance Stairs-Rails: Right;Left;Can reach both Entrance Stairs-Number of Steps: 2 Home Layout: One level Home Equipment: Walker - 2 wheels;Cane - single point      Prior Function Level of Independence: Independent with assistive  device(s)         Comments: [employer], drives, does her own shopping     Hand Dominance        Extremity/Trunk Assessment   Upper Extremity Assessment Upper Extremity Assessment: Generalized weakness    Lower Extremity Assessment Lower Extremity Assessment: Generalized weakness;RLE deficits/detail RLE Deficits / Details: -3/5 RLE: Unable to fully assess due to pain RLE Sensation: WNL RLE Coordination: WNL    Cervical / Trunk Assessment Cervical / Trunk Assessment: Normal  Communication   Communication: No difficulties  Cognition Arousal/Alertness: Awake/alert Behavior During Therapy: WFL for tasks assessed/performed Overall Cognitive Status: Within Functional Limits for tasks assessed                                 General Comments: at times appears to slightly confused      General Comments      Exercises     Assessment/Plan    PT Assessment Patient needs continued PT services  PT Problem List Decreased strength;Decreased activity tolerance;Decreased balance;Decreased mobility       PT Treatment Interventions Gait training;Stair training;Functional mobility training;Therapeutic activities;Therapeutic exercise;Patient/family education;Balance training    PT Goals (Current goals can be found in the Care Plan section)  Acute Rehab PT Goals Patient Stated Goal: return home able to walk PT Goal Formulation: With patient Time For Goal Achievement: 01/02/20 Potential to Achieve Goals: Good    Frequency Min 3X/week   Barriers to discharge        Co-evaluation               AM-PAC PT "6 Clicks" Mobility  Outcome Measure Help needed turning from your back to your side while in a flat bed without using bedrails?: A Lot Help needed moving from lying on your back to sitting on the side of a flat bed without using bedrails?: A Lot Help needed moving to and from a bed to a chair (including a wheelchair)?: Total Help  needed standing up from a chair using your arms (e.g., wheelchair or bedside chair)?: Total Help needed to walk in hospital room?: Total Help needed climbing 3-5 steps with a railing? : Total 6 Click Score: 8    End of Session   Activity Tolerance: Patient tolerated treatment well;Patient limited by fatigue;Patient limited by pain Patient left: in bed;with call bell/phone within reach Nurse Communication: Mobility status PT Visit Diagnosis: Unsteadiness on feet (R26.81);Other abnormalities of gait and mobility (R26.89);Muscle weakness (generalized) (M62.81)    Time: 1751-0258 PT Time Calculation (min) (ACUTE ONLY): 31 min   Charges:   PT Evaluation $PT Eval Moderate Complexity: 1 Mod PT Treatments $Therapeutic Activity: 23-37 mins        10:33 AM, January 06, 2020 Ocie Bob, MPT Physical Therapist with [facility] [phone] office [phone] mobile phone

## 2019-12-19 NOTE — ED Notes (Signed)
ED TO INPATIENT HANDOFF REPORT  ED Nurse Name and Phone #:  [phone]  S Name/Age/Gender Anita Daniels 84 y.o. female Room/Bed: APA03/APA03  Code Status   Code Status: Full Code  Home/SNF/Other Home Patient oriented to: self, place, time and situation Is this baseline? Yes   Triage Complete: Triage complete  Chief Complaint Intractable back pain [M54.9]  Triage Note Patient brought in by EMS for pain from previous muscle strain. Patient has been having discomfort from her hips and her back. Patient was initially diagnosed with muscle strain at [facility] om 12/10/19 and prescribed hydrocodone for pain. Patient states pain upon touch and states pain medication helped some but did not take all the pain away.    Allergies Allergies  Allergen Reactions  . Iodine Shortness Of Breath and Rash    Level of Care/Admitting Diagnosis ED Disposition    ED Disposition Condition Comment   Admit  Hospital Area: [facility] [[id number]]  Level of Care: Telemetry [5]  Covid Evaluation: Asymptomatic Screening Protocol (No Symptoms)  Diagnosis: Intractable back pain [720110]  Admitting Physician: Bobette Mo [[id number]]  Attending Physician: Bobette Mo [[id number]]       B Medical/Surgery History Past Medical History:  Diagnosis Date  . Anxiety 03/16/2019  . Chronic combined systolic and diastolic heart failure (HCC) 03/29/2019  . Gastroesophageal reflux disease 12/09/2018  . Hyperlipidemia   . Hypertension    Past Surgical History:  Procedure Laterality Date  . ABDOMINAL HYSTERECTOMY    . PARTIAL KNEE ARTHROPLASTY       A IV Location/Drains/Wounds Patient Lines/Drains/Airways Status   Active Line/Drains/Airways    Name:   Placement date:   Placement time:   Site:   Days:   Peripheral IV 2020/01/11 Left Hand   01-11-20    0603    Hand   less than 1          Intake/Output Last 24 hours No intake or output data in the 24 hours ending 2020-01-11  0719  Labs/Imaging Results for orders placed or performed during the hospital encounter of Jan 11, 2020 (from the past 48 hour(s))  Urinalysis, Routine w reflex microscopic     Status: Abnormal   Collection Time: 01-11-2020  4:39 AM  Result Value Ref Range   Color, Urine AMBER (A) YELLOW    Comment: BIOCHEMICALS MAY BE AFFECTED BY COLOR   APPearance HAZY (A) CLEAR   Specific Gravity, Urine 1.021 1.005 - 1.030   pH 6.0 5.0 - 8.0   Glucose, UA NEGATIVE NEGATIVE mg/dL   Hgb urine dipstick LARGE (A) NEGATIVE   Bilirubin Urine NEGATIVE NEGATIVE   Ketones, ur NEGATIVE NEGATIVE mg/dL   Protein, ur NEGATIVE NEGATIVE mg/dL   Nitrite POSITIVE (A) NEGATIVE   Leukocytes,Ua NEGATIVE NEGATIVE   RBC / HPF 21-50 0 - 5 RBC/hpf   WBC, UA 6-10 0 - 5 WBC/hpf   Bacteria, UA RARE (A) NONE SEEN   Squamous Epithelial / LPF 0-5 0 - 5   Mucus PRESENT    Budding Yeast PRESENT     Comment: Performed at [facility], [address], [place], [place] [address]  Comprehensive metabolic panel     Status: Abnormal   Collection Time: 11-Jan-2020  5:53 AM  Result Value Ref Range   Sodium 136 135 - 145 mmol/L   Potassium 3.7 3.5 - 5.1 mmol/L   Chloride 99 98 - 111 mmol/L   CO2 28 22 - 32 mmol/L   Glucose, Bld 139 (H) 70 -  99 mg/dL    Comment: Glucose reference range applies only to samples taken after fasting for at least 8 hours.   BUN 21 8 - 23 mg/dL   Creatinine, Ser 9.45 0.44 - 1.00 mg/dL   Calcium 8.6 (L) 8.9 - 10.3 mg/dL   Total Protein 6.5 6.5 - 8.1 g/dL   Albumin 3.3 (L) 3.5 - 5.0 g/dL   AST 31 15 - 41 U/L   ALT 23 0 - 44 U/L   Alkaline Phosphatase 140 (H) 38 - 126 U/L   Total Bilirubin 1.4 (H) 0.3 - 1.2 mg/dL   GFR calc non Af Amer >60 >60 mL/min   GFR calc Af Amer >60 >60 mL/min   Anion gap 9 5 - 15    Comment: Performed at [facility], [address], [place], [place] [address]   No results found.  Pending Labs [employer] (From admission, onward)    Start     Ordered   2020-01-11 0658   Magnesium  Add-on,   AD     01/11/2020 0657   01-11-2020 0658  Phosphorus  Add-on,   AD     11-Jan-2020 0657   2020/01/11 0610  SARS Coronavirus 2 by RT PCR (hospital order, performed in [facility] hospital lab) Nasopharyngeal Nasopharyngeal Swab  (Tier 2 (TAT 2 hrs))  Once,   STAT    Question Answer Comment  Is this test for diagnosis or screening Screening   Symptomatic for COVID-19 as defined by CDC No   Hospitalized for COVID-19 No   Admitted to ICU for COVID-19 No   Previously tested for COVID-19 No   Resident in a congregate (group) care setting No   Employed in healthcare setting No   Pregnant No   Has patient completed COVID vaccination(s) (2 doses of Pfizer/Moderna 1 dose of [employer]) Unknown      11-Jan-2020 0609   Jan 11, 2020 0518  CBC with Differential  Once,   STAT     01/11/2020 0517   January 11, 2020 0510  Urine culture  ONCE - STAT,   STAT    Question:  Patient immune status  Answer:  Normal   01/11/2020 0509          Vitals/Pain Today's Vitals   2020-01-11 0345 01-11-20 0400 January 11, 2020 0600 01/11/2020 0630  BP:  90/66  107/67  Pulse:  77 73 74  Resp:   18 15  Temp:      TempSrc:      SpO2:  97% 94% 92%  Weight: 115.7 kg     Height: 5\' 9"  (1.753 m)     PainSc:        Isolation Precautions No active isolations  Medications Medications  ALPRAZolam (XANAX) tablet 0.25 mg (has no administration in time range)  apixaban (ELIQUIS) tablet 5 mg (has no administration in time range)  atorvastatin (LIPITOR) tablet 20 mg (has no administration in time range)  citalopram (CELEXA) tablet 20 mg (has no administration in time range)  cyclobenzaprine (FLEXERIL) tablet 5 mg (has no administration in time range)  furosemide (LASIX) tablet 20 mg (has no administration in time range)  pantoprazole (PROTONIX) EC tablet 40 mg (has no administration in time range)  amiodarone (PACERONE) tablet 200 mg (has no administration in time range)  magnesium oxide (MAG-OX) tablet 400 mg (has no  administration in time range)  metoprolol tartrate (LOPRESSOR) tablet 25 mg (has no administration in time range)  losartan (COZAAR) tablet 25 mg (has no administration in time  range)  ondansetron (ZOFRAN) tablet 4 mg (has no administration in time range)    Or  ondansetron (ZOFRAN) injection 4 mg (has no administration in time range)  acetaminophen (TYLENOL) tablet 650 mg (has no administration in time range)    Or  acetaminophen (TYLENOL) suppository 650 mg (has no administration in time range)  oxyCODONE (Oxy IR/ROXICODONE) immediate release tablet 5 mg (has no administration in time range)  methocarbamol (ROBAXIN) tablet 750 mg (has no administration in time range)  potassium chloride SA (KLOR-CON) CR tablet 20 mEq (has no administration in time range)  potassium chloride SA (KLOR-CON) CR tablet 10 mEq (has no administration in time range)  cyclobenzaprine (FLEXERIL) tablet 5 mg (5 mg Oral Given 12/31/2019 0424)  fentaNYL (SUBLIMAZE) injection 25 mcg (25 mcg Intravenous Given 12/12/2019 0628)  LORazepam (ATIVAN) tablet 1 mg (1 mg Oral Given 12/23/2019 0093)    Mobility walks with device High fall risk   Focused Assessments    R Recommendations: See Admitting Provider Note  Report given to:   Additional Notes:

## 2019-12-19 NOTE — ED Notes (Signed)
Opti foam dressing placed on sacral area due to skin breakdown.

## 2019-12-19 NOTE — ED Notes (Signed)
Per Corrie Dandy with MRI, due to pt girth, she will need to go to [facility] for MRI, Corrie Dandy will advise hospitalist

## 2019-12-20 DIAGNOSIS — E785 Hyperlipidemia, unspecified: Secondary | ICD-10-CM

## 2019-12-20 DIAGNOSIS — I5042 Chronic combined systolic (congestive) and diastolic (congestive) heart failure: Secondary | ICD-10-CM | POA: Diagnosis not present

## 2019-12-20 DIAGNOSIS — Z20822 Contact with and (suspected) exposure to covid-19: Secondary | ICD-10-CM | POA: Diagnosis not present

## 2019-12-20 DIAGNOSIS — I480 Paroxysmal atrial fibrillation: Secondary | ICD-10-CM | POA: Diagnosis not present

## 2019-12-20 DIAGNOSIS — Z6837 Body mass index (BMI) 37.0-37.9, adult: Secondary | ICD-10-CM | POA: Diagnosis not present

## 2019-12-20 DIAGNOSIS — Z79899 Other long term (current) drug therapy: Secondary | ICD-10-CM | POA: Diagnosis not present

## 2019-12-20 DIAGNOSIS — K219 Gastro-esophageal reflux disease without esophagitis: Secondary | ICD-10-CM | POA: Diagnosis present

## 2019-12-20 DIAGNOSIS — S3210XA Unspecified fracture of sacrum, initial encounter for closed fracture: Secondary | ICD-10-CM | POA: Diagnosis not present

## 2019-12-20 DIAGNOSIS — Z90710 Acquired absence of both cervix and uterus: Secondary | ICD-10-CM | POA: Diagnosis not present

## 2019-12-20 DIAGNOSIS — R262 Difficulty in walking, not elsewhere classified: Secondary | ICD-10-CM | POA: Diagnosis not present

## 2019-12-20 DIAGNOSIS — I10 Essential (primary) hypertension: Secondary | ICD-10-CM

## 2019-12-20 DIAGNOSIS — M549 Dorsalgia, unspecified: Secondary | ICD-10-CM | POA: Diagnosis not present

## 2019-12-20 DIAGNOSIS — I469 Cardiac arrest, cause unspecified: Secondary | ICD-10-CM | POA: Diagnosis not present

## 2019-12-20 DIAGNOSIS — I110 Hypertensive heart disease with heart failure: Secondary | ICD-10-CM | POA: Diagnosis present

## 2019-12-20 DIAGNOSIS — L89152 Pressure ulcer of sacral region, stage 2: Secondary | ICD-10-CM | POA: Diagnosis not present

## 2019-12-20 DIAGNOSIS — N390 Urinary tract infection, site not specified: Secondary | ICD-10-CM | POA: Diagnosis not present

## 2019-12-20 DIAGNOSIS — G9341 Metabolic encephalopathy: Secondary | ICD-10-CM | POA: Diagnosis not present

## 2019-12-20 DIAGNOSIS — M545 Low back pain: Secondary | ICD-10-CM | POA: Diagnosis not present

## 2019-12-20 DIAGNOSIS — Z7901 Long term (current) use of anticoagulants: Secondary | ICD-10-CM | POA: Diagnosis not present

## 2019-12-20 DIAGNOSIS — Z888 Allergy status to other drugs, medicaments and biological substances status: Secondary | ICD-10-CM | POA: Diagnosis not present

## 2019-12-20 DIAGNOSIS — N3000 Acute cystitis without hematuria: Secondary | ICD-10-CM

## 2019-12-20 DIAGNOSIS — E559 Vitamin D deficiency, unspecified: Secondary | ICD-10-CM | POA: Diagnosis present

## 2019-12-20 DIAGNOSIS — F419 Anxiety disorder, unspecified: Secondary | ICD-10-CM | POA: Diagnosis present

## 2019-12-20 DIAGNOSIS — X501XXA Overexertion from prolonged static or awkward postures, initial encounter: Secondary | ICD-10-CM | POA: Diagnosis not present

## 2019-12-20 LAB — VITAMIN B12: Vitamin B-12: 448 pg/mL (ref 180–914)

## 2019-12-20 LAB — VITAMIN D 25 HYDROXY (VIT D DEFICIENCY, FRACTURES): Vit D, 25-Hydroxy: 4.87 ng/mL — ABNORMAL LOW (ref 30–100)

## 2019-12-20 MED ORDER — SODIUM CHLORIDE 0.9 % IV SOLN
1.0000 g | INTRAVENOUS | Status: DC
  Administered 2019-12-20: 1 g via INTRAVENOUS
  Filled 2019-12-20: qty 10

## 2019-12-20 MED ORDER — VITAMIN D (ERGOCALCIFEROL) 1.25 MG (50000 UNIT) PO CAPS
50000.0000 [IU] | ORAL_CAPSULE | ORAL | Status: DC
  Administered 2019-12-20: 50000 [IU] via ORAL
  Filled 2019-12-20: qty 1

## 2019-12-21 DIAGNOSIS — I469 Cardiac arrest, cause unspecified: Secondary | ICD-10-CM

## 2019-12-21 DIAGNOSIS — K219 Gastro-esophageal reflux disease without esophagitis: Secondary | ICD-10-CM

## 2019-12-21 DIAGNOSIS — L89141 Pressure ulcer of left lower back, stage 1: Secondary | ICD-10-CM

## 2019-12-21 LAB — URINE CULTURE
Culture: >=100000 — AB
Special Requests: NORMAL

## 2019-12-21 MED FILL — Medication: Qty: 1 | Status: AC

## 2019-12-22 ENCOUNTER — Telehealth: Payer: Medicare HMO | Admitting: Nurse Practitioner

## 2019-12-23 DIAGNOSIS — Z03818 Encounter for observation for suspected exposure to other biological agents ruled out: Secondary | ICD-10-CM | POA: Diagnosis not present

## 2019-12-23 DIAGNOSIS — N390 Urinary tract infection, site not specified: Secondary | ICD-10-CM | POA: Diagnosis not present

## 2019-12-23 DIAGNOSIS — M545 Low back pain: Secondary | ICD-10-CM | POA: Diagnosis not present

## 2019-12-23 DIAGNOSIS — R262 Difficulty in walking, not elsewhere classified: Secondary | ICD-10-CM | POA: Diagnosis not present

## 2019-12-27 NOTE — Discharge Summary (Signed)
Death Summary  Anita Daniels ZHG:[id number] DOB: [birth year] DOA: January 04, 2020  PCP: Chevis Pretty, FNP PCP/Office notified: office notified through [place].  Admit date: 2020/01/04 Date of Death: Jan 05, 2020  Final Diagnoses:  Principal Problem:   Intractable back pain Active Problems:   Essential hypertension   Hyperlipidemia   Gastroesophageal reflux disease   Anxiety   Chronic combined systolic and diastolic heart failure (HCC)   Class 2 obesity due to excess calories with body mass index (BMI) of 37.0 to 37.9 in adult   Pressure injury of lower back, stage 1   Paroxysmal atrial fibrillation (HCC)   UTI (urinary tract infection)   History of present illness:  As per H&P written by Dr. Olevia Bowens on 2020/01/04 84 y.o.femalewith medical history significant ofanxiety, chronic combined systolic and diastolic heart failure, GERD, hyperlipidemia, hypertension, paroxysmal atrial fibrillation who is coming to the emergency department due to progressively worse back pain since she nearly fell on a 2 step stairs while holding her cane and carrying a bag and some flowers with her other hand. She mentions she felt like she twisted to her left. After this, she did not had any mediated symptoms, but 2 days later she developed intense pain in her lumbosacral area. She went to [facility] ED had a lumbosacral CT scan, pelvic CT and lower CT scan, which did not show any acute abnormality. She was discharged home with hydrocodone. She has been taking half a tablet, but states that it makes her feel tremulous. She has been staying in bed to the point of developing pressure injury on her back due to pain intensity. He has been treating this with a foot cream and Lotrimin powder, which she says has improved the area. She denies pain radiating down her legs, fecal or urinary incontinence. No dysuria, frequency or hematuria, but she feels she has to go often. She denies fever, chills, rhinorrhea, sore throat,  wheezing, or hemoptysis. She gets dyspnea on exertion. No chest pain, palpitations, dizziness, diaphoresis, PND, orthopnea or pitting edema of the lower extremities. Shehad an episode of diarrhea 3 days ago, butdenies abdominal pain, nausea, emesis, constipation, melena or hematochezia. No polyuria, polydipsia, polyphagia or blurred vision.  Hospital Course:  1-PEA/asystole Arrest -patient experienced non-expected event of asystole/PEA -Despite Code Blue resuscitation, 2 rounds of Epinephrine and good quality CPR she remained unresponsive and in asystole throughout entire resuscitation process.  -Patient had a low chance of survival with meaningful neurologic outcome.  CPR was discontinued and the patient was declared dead at 11-30-13.  2-intractable back pain: In the setting of nondisplaced sacral fracture -Continue as needed pain medication -Continue physical therapy -Nonsurgical intervention anticipated -Weightbearing as tolerated -might required approximately 4 weeks of rehabilitation and time before this heals.  3-UTI/acute encephalopathy -Empiric Rocephin started while waiting on urine culture/sensitivity results. -Constant reorientation and continue use of low-dose nightly Seroquel in place.  4-Essential hypertension -Soft but stable -Patient kept on her antihypertensive regimen with holding parameters.Marland Kitchen  5-Hyperlipidemia -Statins continued.  6-Gastroesophageal reflux disease -PPI therapy continued.  7-vitamin D deficiency -Vitamin D level found at 4.87 -she was Started on 50,000 units weekly  8-Anxiety -PRN anxiolytics continued.  9-Chronic combined systolic and diastolic heart failure (HCC) -Overall compensated -Daily weights and strict I's and O's monitored. -tx with beta-blockers continued.  10-Class 2 obesity due to excess calories with body mass index (BMI) of 37.0 to 37.9 in adult -Body mass index is 36.98 kg/m. -Low calorie diet, portion control  and lifestyle changes discussed with patient  and daughter at bedside.  11-Pressure injury of lower back, stage 2 -Preventive measures and Constant repositioning provided. -this pressure injury was present at time of admission.  12-hx of Paroxysmal atrial fibrillation (HCC) -continue metoprolol, pacerone and eliquis -continue monitoring on telemetry   Time: 25 minutes  Signed:  Vassie Loll  [facility] 12/27/2019, 9:03 AM

## 2020-01-02 NOTE — Progress Notes (Signed)
Patient MEWS score 2, Yellow BP 102/48, Heart rate 128. Sinus Tach on ECG monitor. Respirations 20, 93% on room air. Patient drowsy and requesting to sleep. Midlevel notified.  Instructed to hold Seroquel due to drowsiness.

## 2020-01-02 NOTE — Progress Notes (Signed)
Patient heart rate has been elevated in the 130s since around 1230 according to cardiac monitoring system records. Patient was agitated this morning and given PRN medication and her elevated heart rate persists. Patient is currently resting in bed with eyes closed and daughter is rubbing her back and her rate is in the 130s. MD is made aware.

## 2020-01-02 NOTE — ED Provider Notes (Signed)
    Department of Emergency Medicine CODE BLUE CONSULTATION    CONSULT NOTE  Chief Complaint: Cardiac arrest/unresponsive   Level V Caveat: Unresponsive  History of present illness: I was contacted by the hospital for a CODE BLUE cardiac arrest upstairs and presented to the patient's bedside.  According to the medical records the patient was admitted to the hospital for back pain.  Patient was started on pain medications.  Plan was for PT OT as well as MRI of the spine.  Patient was noted to have acute to subacute mildly displaced fractures of the bilateral sacral ala.  Patient's nurse indicated she was notified by telemetry that the patient was becoming bradycardic.  The nurse responded to the bedside and noted that the patient was unresponsive.  She was asystolic.  CODE BLUE was activated.     ROS: Unable to obtain, Level V caveat  Scheduled Meds: . amiodarone  200 mg Oral Daily  . apixaban  5 mg Oral BID  . atorvastatin  20 mg Oral Daily  . citalopram  20 mg Oral Daily  . furosemide  20 mg Oral Daily  . losartan  25 mg Oral Daily  . magnesium oxide  400 mg Oral Daily  . metoprolol tartrate  25 mg Oral BID  . pantoprazole  40 mg Oral Daily  . potassium chloride  10 mEq Oral Daily  . QUEtiapine  12.5 mg Oral QHS  . Vitamin D (Ergocalciferol)  50,000 Units Oral Q7 days   Continuous Infusions: . cefTRIAXone (ROCEPHIN)  IV 1 g (12/30/2019 1138)   PRN Meds:.acetaminophen **OR** acetaminophen, ALPRAZolam, methocarbamol, ondansetron **OR** ondansetron (ZOFRAN) IV, oxyCODONE Past Medical History:  Diagnosis Date  . Anxiety 03/16/2019  . Chronic combined systolic and diastolic heart failure (HCC) 03/29/2019  . Gastroesophageal reflux disease 12/09/2018  . Hyperlipidemia   . Hypertension     Allergies  Allergen Reactions  . Iodine Shortness Of Breath and Rash    Last set of Vital Signs (not current) Vitals:   2019/12/30 1939 12/30/2019 11-22-2049  BP:  (!) 102/48  Pulse:  (!) 128   Resp:  20  Temp:  99.1 F (37.3 C)  SpO2: 93% 93%      Physical Exam  Gen: unresponsive Cardiovascular: pulseless  Resp: apneic. Breath sounds equal bilaterally with bagging  Abd: nondistended  Neuro: GCS 3, unresponsive to pain  HEENT: Evidence of emesis around the mouth, Neck: No crepitus  Musculoskeletal: No deformity  Skin: warm   Procedures   Cardiopulmonary Resuscitation (CPR) Procedure Note Directed/Performed by: Linwood Dibbles I personally directed ancillary staff and/or performed CPR in an effort to regain return of spontaneous circulation and to maintain cardiac, neuro and systemic perfusion.    Medical Decision Patient was given 2 rounds of epinephrine.  She had good quality CPR.  Patient remained unresponsive.  She remained in asystole throughout the entire CPR.  Patient had a low chance of survival with meaningful neurologic outcome.  CPR was discontinued and the patient was declared dead at Nov 22, 2213   Linwood Dibbles, MD 12-30-2019 11-23-31

## 2020-01-02 NOTE — Progress Notes (Signed)
Notified by telemetry at 12-12-2200 that patient's heart rate was in the 40's. Upon entering room at Dec 12, 2201, patient apneic, Heart rate 30 before becoming asystolic. Code blue initiated at 12/12/01. Coffee ground emesis noted to left side of patients face. No respirations, no pulse, patient unresponsive. CPR initiated at 2201-12-12. Assisted ventilation initiated at 2203/12/13 and EDP at bedside. Patient given two amps of epinephrine during code. No return of pulse. Resuscitation efforts ceased. Code ended by EDP. Time of death at Dec 12, 2212.

## 2020-01-02 NOTE — Progress Notes (Signed)
PROGRESS NOTE    Anita Daniels  ZOX:[id number] DOB: 1935-10-19 DOA: 12/21/2019 PCP: Bennie Pierini, FNP   Chief Complaint  Patient presents with  . Muscle Pain    hip, back pain    Brief Narrative:  As per H&P written by Dr. Robb Matar on 12/30/2019 84 y.o. female with medical history significant of anxiety, chronic combined systolic and diastolic heart failure, GERD, hyperlipidemia, hypertension, paroxysmal atrial fibrillation who is coming to the emergency department due to progressively worse back pain since she nearly fell on a 2 step stairs while holding her cane and carrying a bag and some flowers with her other hand.  She mentions she felt like she twisted to her left.  After this, she did not had any mediated symptoms, but 2 days later she developed intense pain in her lumbosacral area.  She went to [facility] ED had a lumbosacral CT scan, pelvic CT and lower CT scan, which did not show any acute abnormality.  She was discharged home with hydrocodone.  She has been taking half a tablet, but states that it makes her feel tremulous.  She has been staying in bed to the point of developing pressure injury on her back due to pain intensity.  He has been treating this with a foot cream and Lotrimin powder, which she says has improved the area.  She denies pain radiating down her legs, fecal or urinary incontinence.  No dysuria, frequency or hematuria, but she feels she has to go often.  She denies fever, chills, rhinorrhea, sore throat, wheezing, or hemoptysis.  She gets dyspnea on exertion.  No chest pain, palpitations, dizziness, diaphoresis, PND, orthopnea or pitting edema of the lower extremities.  She had an episode of diarrhea 3 days ago, but denies abdominal pain, nausea, emesis, constipation, melena or hematochezia.  No polyuria, polydipsia, polyphagia or blurred vision.  ED Course: Initial vital signs temperature 99 F, pulse 85, respirations 18, O2 sat 100% on room air and blood pressure  105/65 mmHg.  Patient was given fentanyl 25 mcg IVP and Flexeril 5 mg p.o. oral.  Urinalysis has a hazy appearance with large hemoglobinuria, positive nitrites, 21-50 RBC and 6-10 WBC per hpf with rare bacteria on microscopic examination.  CMP, CBC, urine culture and MRI without contrast of the lumbosacral spine are still pending.  I will sign out pending results to incoming dayshift provider.  Assessment & Plan: 1-intractable back pain: In the setting of nondisplaced sacral fracture -Continue as needed pain medication -Continue physical therapy -Nonsurgical intervention anticipated -Weightbearing as tolerated -might required approximately 4 weeks of rehabilitation and time before this heals.  2-UTI/acute encephalopathy -Continue empirical Rocephin -Follow urine culture and sensitivity -Maintain adequate hydration -Constant reorientation and continue low-dose nightly Seroquel.  3-Essential hypertension -Soft but stable -Continue to monitor vital signs -Continue current antihypertensive regimen.  4-Hyperlipidemia -Continue statins.  5-Gastroesophageal reflux disease -Continue PPI  6-vitamin D deficiency -Vitamin D level 4.87 -Started on 50,000 units weekly  7-Anxiety -Continue as needed anxiolytics.  8-Chronic combined systolic and diastolic heart failure (HCC) -Overall compensated -Continue to follow daily weights and strict I's and O's -Continue beta-blockers  9-Class 2 obesity due to excess calories with body mass index (BMI) of 37.0 to 37.9 in adult -Body mass index is 36.98 kg/m. -Low calorie diet, portion control and lifestyle changes discussed with patient and daughter at bedside.  10-Pressure injury of lower back, stage 2 -Continue preventive measures -Constant repositioning -this pressure injury was present at time of admission.  11-Paroxysmal  atrial fibrillation (HCC) -continue metoprolol, pacerone and eliquis -continue monitoring on telemetry  DVT  prophylaxis: Chronically on Eliquis. Code Status: Full code Family Communication: Daughter at bedside. Disposition:   Status is: Inpatient  Dispo: The patient is from: SNF              Anticipated d/c is to: Home              Anticipated d/c date is: 5/22 or 5/23, based on patient clinical response              Patient currently no medically stable in the setting of continued to be unable to ambulate, stand up or bear weights.  Continue-intractable lower back pain and also intermittent episode of confusion.  Continue treatment for acute encephalopathy triggered by UTI and polypharmacy.  Replete vitamin D (which was found to be low).  Patient will require skilled nursing facility for rehabilitation and further care.        Consultants:   None    Procedures:  See below for x-ray reports.   Antimicrobials:  Rocephin (01/11/20).   Subjective: Continued to experience intermittent confusion; no chest pain, no shortness of breath, no nausea vomiting.  Complaining of lower back pain and being significantly weak to stand up, ambulate or bear weight.  Objective: Vitals:   01-11-20 2019 01/11/2020 2350 12/10/2019 0406 12/25/2019 1425  BP: (!) 87/55 109/64 104/62 103/90  Pulse: 77 69 71 (!) 109  Resp: 17     Temp: 98 F (36.7 C) 97.8 F (36.6 C) 98.7 F (37.1 C)   TempSrc:      SpO2: 95% 93% 93%   Weight:      Height:        Intake/Output Summary (Last 24 hours) at 12/05/2019 1704 Last data filed at 12/06/2019 0827 Gross per 24 hour  Intake 360 ml  Output 100 ml  Net 260 ml   Filed Weights   2020-01-11 0345 01/11/2020 0900  Weight: 115.7 kg 113.6 kg    Examination:  General exam: Afebrile, no chest pain, no nausea, no vomiting.  Continue complaining of lower back pain, is weak, deconditioned and having significant trouble repositioning or standing.  Respiratory system: Clear to auscultation. Respiratory effort normal.  Good oxygen saturation on room air.  Cardiovascular  system: Rate controlled, no rubs, no gallops or JVD on exam. Gastrointestinal system: Abdomen is nondistended, soft and nontender. No organomegaly or masses felt. Normal bowel sounds heard. Central nervous system: Alert and oriented. No focal neurological deficits. Extremities: Symmetric 3/5 bilaterally in the setting of poor effort. Skin: No petechiae; a stage II sacral pressure injury present at time of admission.  No signs of superimposed infection. Psychiatry: Mood & affect appropriate.     Data Reviewed: I have personally reviewed following labs and imaging studies  CBC: Recent Labs  Lab 2020/01/11 0553  WBC 23.5*  NEUTROABS 16.2*  HGB 15.1*  HCT 46.6*  MCV 98.5  PLT 270    Basic Metabolic Panel: Recent Labs  Lab 01-11-2020 0553  NA 136  K 3.7  CL 99  CO2 28  GLUCOSE 139*  BUN 21  CREATININE 0.68  CALCIUM 8.6*  MG 2.0  PHOS 2.7    GFR: Estimated Creatinine Clearance: 74.2 mL/min (by C-G formula based on SCr of 0.68 mg/dL).  Liver Function Tests: Recent Labs  Lab 01-11-20 0553  AST 31  ALT 23  ALKPHOS 140*  BILITOT 1.4*  PROT 6.5  ALBUMIN 3.3*  CBG: No results for input(s): GLUCAP in the last 168 hours.   Recent Results (from the past 240 hour(s))  Urine culture     Status: Abnormal (Preliminary result)   Collection Time: 12/23/2019  5:10 AM   Specimen: Urine, Catheterized  Result Value Ref Range Status   Specimen Description   Final    URINE, CATHETERIZED Performed at [facility], [address] [place], [place] [address]    Special Requests   Final    Normal Performed at [facility], [address] [place], [place] [address]    Culture >=100,000 COLONIES/mL ESCHERICHIA COLI (A)  Final   Report Status PENDING  Incomplete  SARS Coronavirus 2 by RT PCR (hospital order, performed in [facility] hospital lab) Nasopharyngeal Nasopharyngeal Swab     Status: None   Collection Time: 12/18/2019  6:10 AM   Specimen: Nasopharyngeal Swab  Result Value  Ref Range Status   SARS Coronavirus 2 NEGATIVE NEGATIVE Final    Comment: (NOTE) SARS-CoV-2 target nucleic acids are NOT DETECTED. The SARS-CoV-2 RNA is generally detectable in upper and lower respiratory specimens during the acute phase of infection. The lowest concentration of SARS-CoV-2 viral copies this assay can detect is 250 copies / mL. A negative result does not preclude SARS-CoV-2 infection and should not be used as the sole basis for treatment or other patient management decisions.  A negative result may occur with improper specimen collection / handling, submission of specimen other than nasopharyngeal swab, presence of viral mutation(s) within the areas targeted by this assay, and inadequate number of viral copies (<250 copies / mL). A negative result must be combined with clinical observations, patient history, and epidemiological information. Fact Sheet for Patients:   BoilerBrush.com.cy Fact Sheet for Healthcare Providers: https://pope.com/ This test is not yet approved or cleared  by the [place] FDA and has been authorized for detection and/or diagnosis of SARS-CoV-2 by FDA under an Emergency Use Authorization (EUA).  This EUA will remain in effect (meaning this test can be used) for the duration of the COVID-19 declaration under Section 564(b)(1) of the Act, 21 U.S.C. section 360bbb-3(b)(1), unless the authorization is terminated or revoked sooner. Performed at [facility], [address] [place], [place] [address]      Radiology Studies: MR LUMBAR SPINE WO CONTRAST  Result Date: 12/19/2019 CLINICAL DATA:  84 year old female with bilateral hip pain. EXAM: MRI LUMBAR SPINE WITHOUT CONTRAST TECHNIQUE: Multiplanar, multisequence MR imaging of the lumbar spine was performed. No intravenous contrast was administered. COMPARISON:  Lumbar spine CT [facility] 12/09/2019 FINDINGS: Segmentation:  Normal on the  comparison CT. Alignment: Moderate levoconvex lumbar scoliosis and straightening of lumbar lordosis are stable from the recent CT. Vertebrae: Only T2 weighted axial images are provided, along with the full complement of sagittal MRI images. Mildly displaced central sacral fracture at the S2-S3 level (series 8, image 11) associated with confluent marrow edema at those central sacral vertebrae (series 6, image 11) as well as widespread bilateral sacral ala marrow edema (series 6, images 6 and 17). Fluid-filled fracture clefts of the anterior superior ala (series 9, image 33). The ala appear relatively nondisplaced. SI joint distance remains normal. Widespread advanced lumbar endplate degeneration and occasional lumbar Schmorl's nodes but no marrow edema or evidence of acute osseous abnormality in the lumbar spine. Conus medullaris and cauda equina: Conus extends to the L1-L2 level. Ventriculus terminalis (normal variant) noted in the distal cord and conus. No definite abnormal lower thoracic spinal cord signal.  Paraspinal and other soft tissues: Mild presacral and parasacral soft tissue edema. Negative visible abdominal and pelvic viscera. Disc levels: Widespread severe lower thoracic and lumbar disc and endplate degeneration as seen by CT. There is up to moderate degenerative lateral recess stenosis on the right at L2-L3, on the right at L3-L4, and on the left at L4-L5. Up to mild overall lumbar spinal stenosis. IMPRESSION: 1. Acute to subacute comminuted but only mildly displaced fractures of the bilateral sacral ala and the central S2-S3 vertebrae. These could be posttraumatic or insufficiency fractures. 2. No superimposed acute osseous abnormality in the lumbar spine. Multilevel chronic lumbar spine degeneration. Electronically Signed   By: Genevie Ann M.D.   On: 2019-12-26 18:56    Scheduled Meds: . amiodarone  200 mg Oral Daily  . apixaban  5 mg Oral BID  . atorvastatin  20 mg Oral Daily  . citalopram  20 mg  Oral Daily  . furosemide  20 mg Oral Daily  . losartan  25 mg Oral Daily  . magnesium oxide  400 mg Oral Daily  . metoprolol tartrate  25 mg Oral BID  . pantoprazole  40 mg Oral Daily  . potassium chloride  10 mEq Oral Daily  . QUEtiapine  12.5 mg Oral QHS   Continuous Infusions: . cefTRIAXone (ROCEPHIN)  IV 1 g (12/21/2019 1138)     LOS: 0 days    Time spent: 30 minutes    Barton Dubois, MD [facility]   To contact the attending provider between 7A-7P or the covering provider during after hours 7P-7A, please log into the web site [email or web address] and access using universal [place] password for that web site. If you do not have the password, please call the hospital operator.  12/28/2019, 5:04 PM

## 2020-01-02 DEATH — deceased

## 2020-04-22 ENCOUNTER — Ambulatory Visit: Payer: Medicare HMO | Admitting: Nurse Practitioner

## 2020-07-29 IMAGING — MR MR LUMBAR SPINE W/O CM
4 series · 18 of 48 positions shown · non-contrast
Comparison: Lumbar spine CT [HOSPITAL] [facility] 12/09/2019

CLINICAL DATA: 83-year-old female with bilateral hip pain.

EXAM:
MRI LUMBAR SPINE WITHOUT CONTRAST
TECHNIQUE: Multiplanar, multisequence MR imaging of the lumbar spine was
performed. No intravenous contrast was administered.

[Series 5: T2 · sagittal · 4.0mm · 0.55mm/px · 9 of 24 slices shown (1 of 2)]
[im 1/24]
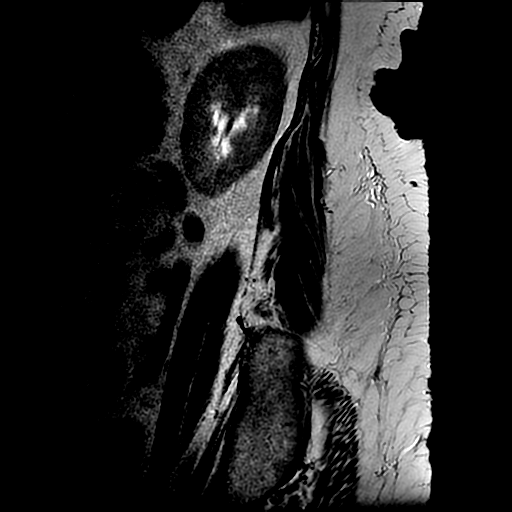
[im 3/24]
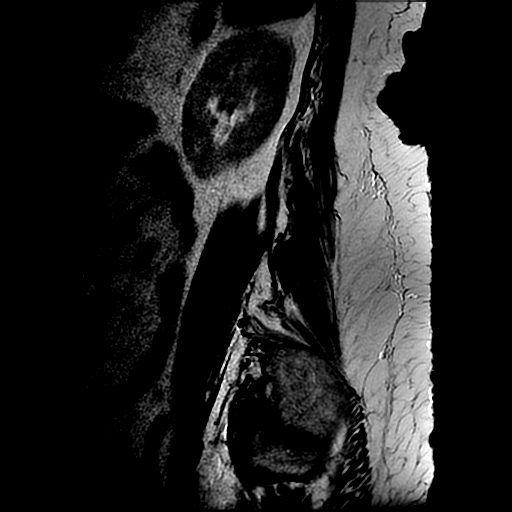
[im 5/24]
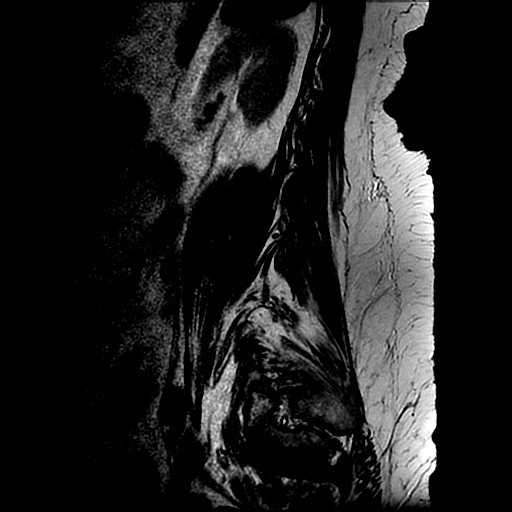
[im 7/24]
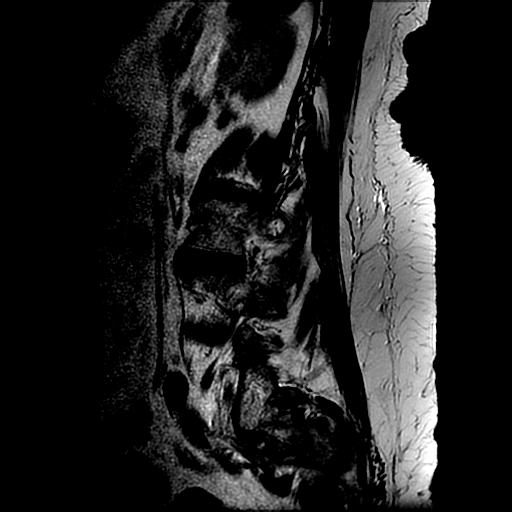
[im 10/24]
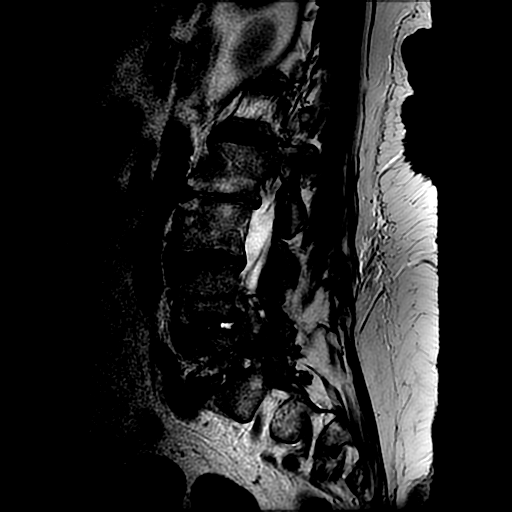
[im 12/24]
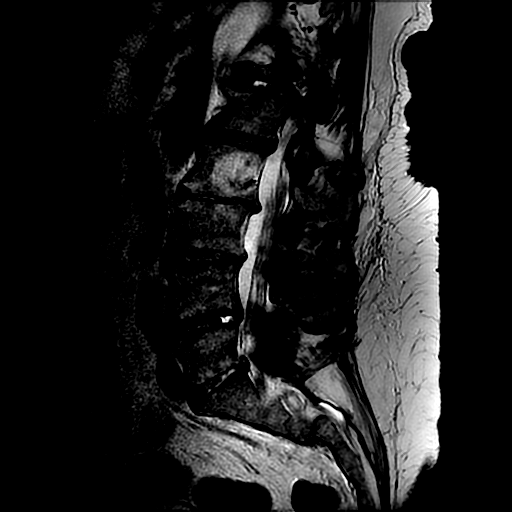
[im 14/24]
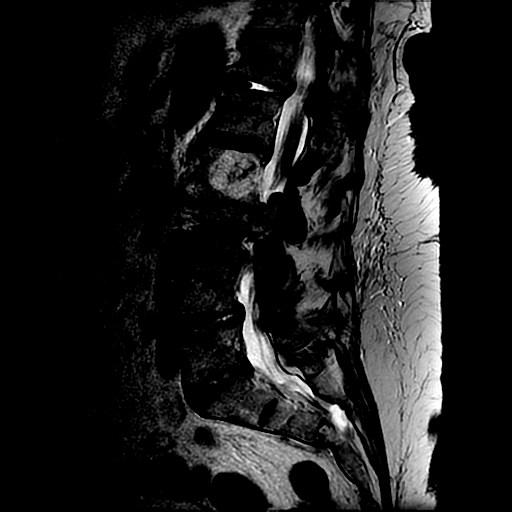
[im 17/24]
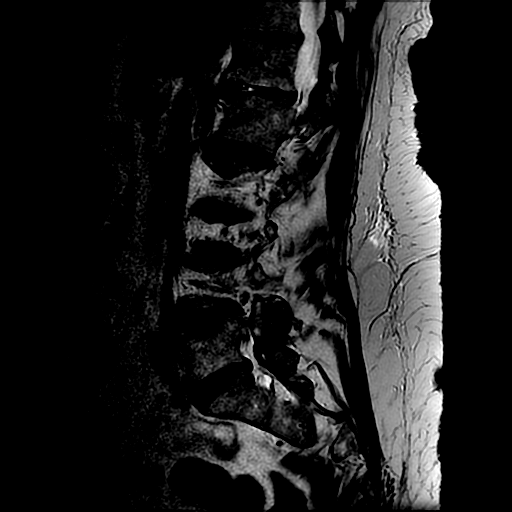
[im 21/24]
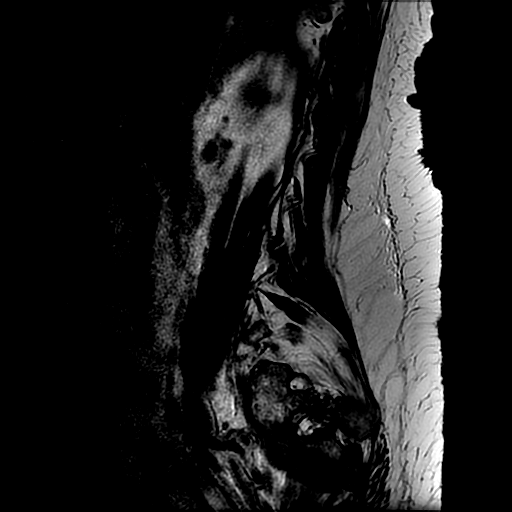

[Series 6: sag ir · sagittal · 4.0mm · 0.55mm/px · 3 of 24 slices shown]
[im 3/24]
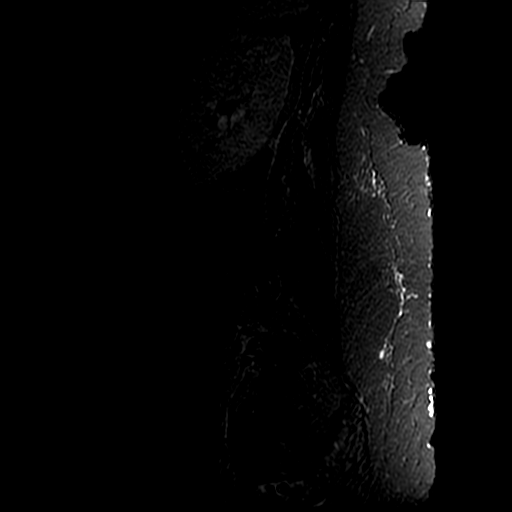
[im 13/24]
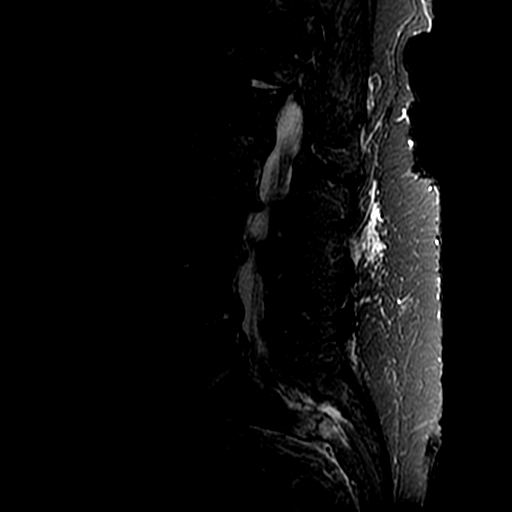
[im 21/24]
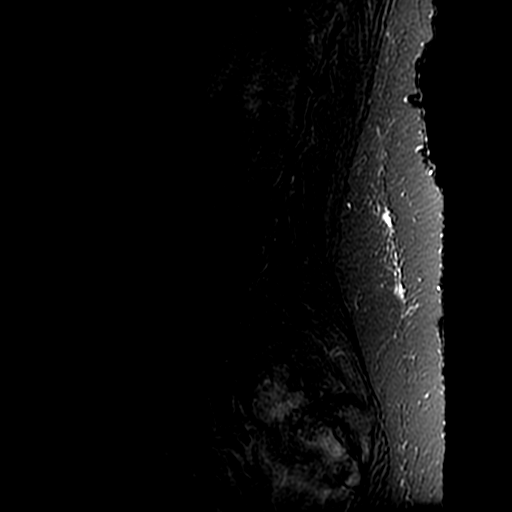

[Series 8: T1 · sagittal · 4.0mm · 0.55mm/px · 3 of 24 slices shown]
[im 3/24]
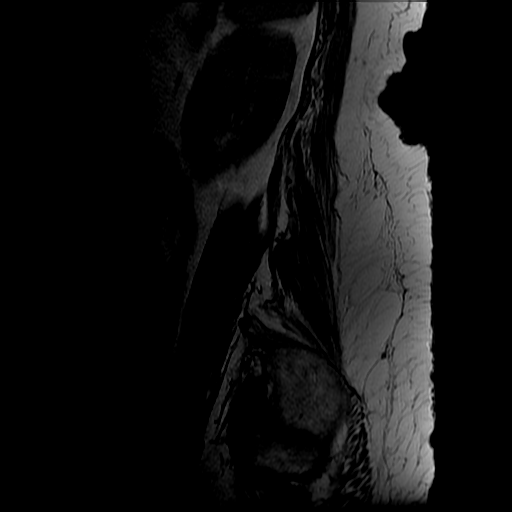
[im 13/24]
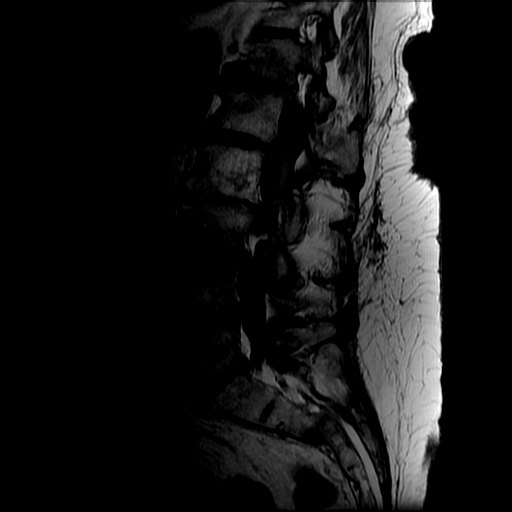
[im 21/24]
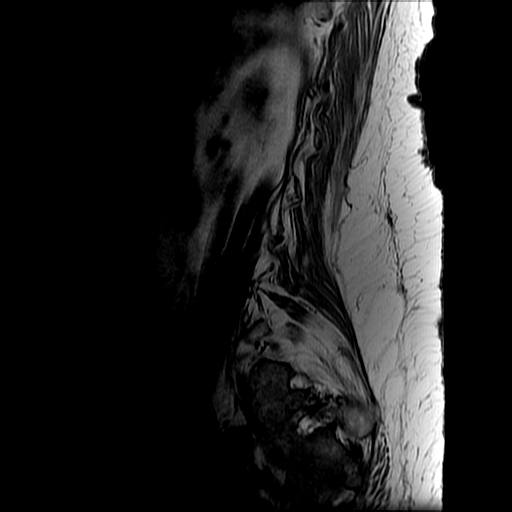

[Series 9: T2 · axial · 4.0mm · 0.39mm/px · z∈[-71,+89]mm · 3 of 40 slices shown (2 of 2)]
[im 5/40]
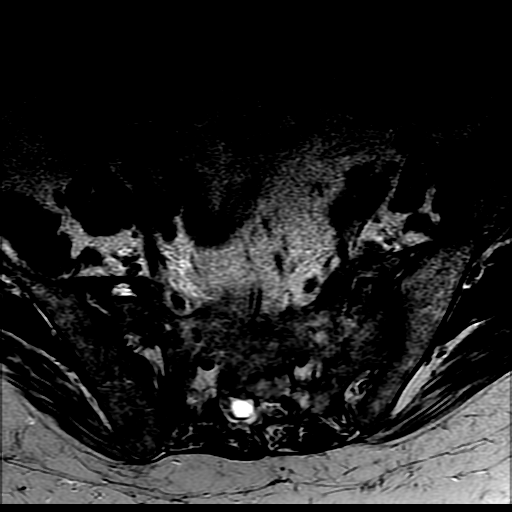
[im 20/40]
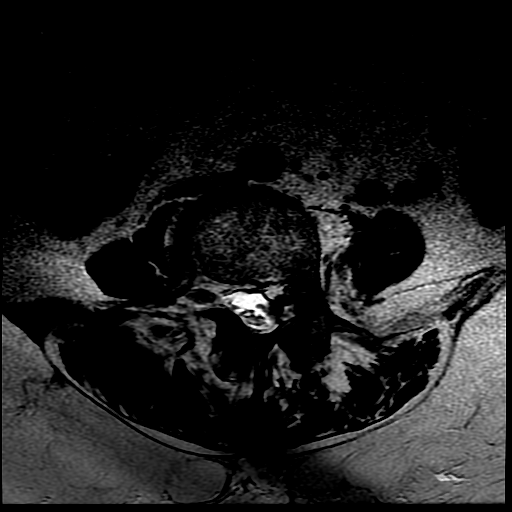
[im 35/40]
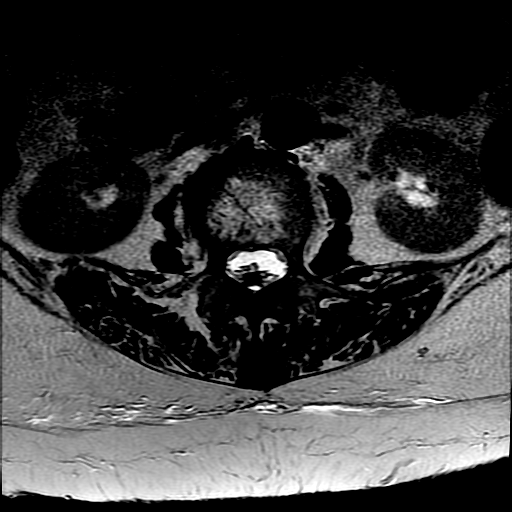

[18 of 48 positions shown; findings below may reference images not displayed]

FINDINGS: Segmentation:  Normal on the comparison CT.

Alignment: Moderate levoconvex lumbar scoliosis and straightening of
lumbar lordosis are stable from the recent CT.

Vertebrae: Only T2 weighted axial images are provided, along with
the full complement of sagittal MRI images.

Mildly displaced central sacral fracture at the S2-S3 level (series
8, image 11) associated with confluent marrow edema at those central
sacral vertebrae (series 6, image 11) as well as widespread
bilateral sacral ala marrow edema (series 6, images 6 and 17).
Fluid-filled fracture clefts of the anterior superior ala (series 9,
image 33). The ala appear relatively nondisplaced. SI joint distance
remains normal.

Widespread advanced lumbar endplate degeneration and occasional
lumbar Schmorl's nodes but no marrow edema or evidence of acute
osseous abnormality in the lumbar spine.

Conus medullaris and cauda equina: Conus extends to the L1-L2 level.
Ventriculus terminalis (normal variant) noted in the distal cord and
conus. No definite abnormal lower thoracic spinal cord signal.

Paraspinal and other soft tissues: Mild presacral and parasacral
soft tissue edema. Negative visible abdominal and pelvic viscera.

Disc levels:

Widespread severe lower thoracic and lumbar disc and endplate
degeneration as seen by CT. There is up to moderate degenerative
lateral recess stenosis on the right at L2-L3, on the right at
L3-L4, and on the left at L4-L5. Up to mild overall lumbar spinal
stenosis.
IMPRESSION: 1. Acute to subacute comminuted but only mildly displaced fractures
of the bilateral sacral ala and the central S2-S3 vertebrae. These
could be posttraumatic or insufficiency fractures.

2. No superimposed acute osseous abnormality in the lumbar spine.
Multilevel chronic lumbar spine degeneration.
